# Patient Record
Sex: Male | Born: 1935 | Race: White | Hispanic: No | Marital: Single | Smoking: Never smoker
Health system: Southern US, Community
[De-identification: ages and names within clinical notes are randomized; demographics above are authoritative.]

## PROBLEM LIST (undated history)

## (undated) ENCOUNTER — Emergency Department (HOSPITAL_COMMUNITY): Admission: EM | Payer: Self-pay | Source: Home / Self Care

## (undated) DIAGNOSIS — R42 Dizziness and giddiness: Secondary | ICD-10-CM

## (undated) DIAGNOSIS — E119 Type 2 diabetes mellitus without complications: Secondary | ICD-10-CM

## (undated) DIAGNOSIS — H9190 Unspecified hearing loss, unspecified ear: Secondary | ICD-10-CM

## (undated) DIAGNOSIS — M169 Osteoarthritis of hip, unspecified: Secondary | ICD-10-CM

## (undated) DIAGNOSIS — I10 Essential (primary) hypertension: Secondary | ICD-10-CM

## (undated) DIAGNOSIS — L719 Rosacea, unspecified: Secondary | ICD-10-CM

## (undated) HISTORY — DX: Osteoarthritis of hip, unspecified: M16.9

## (undated) HISTORY — PX: NO PAST SURGERIES: SHX2092

---

## 2003-06-14 DIAGNOSIS — C439 Malignant melanoma of skin, unspecified: Secondary | ICD-10-CM

## 2003-06-14 DIAGNOSIS — C4491 Basal cell carcinoma of skin, unspecified: Secondary | ICD-10-CM

## 2003-06-14 HISTORY — DX: Malignant melanoma of skin, unspecified: C43.9

## 2003-06-14 HISTORY — DX: Basal cell carcinoma of skin, unspecified: C44.91

## 2008-09-29 ENCOUNTER — Emergency Department (HOSPITAL_COMMUNITY): Admission: EM | Admit: 2008-09-29 | Discharge: 2008-09-29 | Payer: Self-pay | Admitting: Emergency Medicine

## 2011-03-16 ENCOUNTER — Encounter (HOSPITAL_COMMUNITY): Payer: Self-pay | Admitting: Radiology

## 2011-03-16 ENCOUNTER — Emergency Department (HOSPITAL_COMMUNITY): Payer: Medicare Other

## 2011-03-16 ENCOUNTER — Emergency Department (HOSPITAL_COMMUNITY)
Admission: EM | Admit: 2011-03-16 | Discharge: 2011-03-16 | Disposition: A | Discharge status: Home / Self Care | Payer: Medicare Other | Attending: Emergency Medicine | Admitting: Emergency Medicine

## 2011-03-16 DIAGNOSIS — R42 Dizziness and giddiness: Secondary | ICD-10-CM | POA: Insufficient documentation

## 2011-03-16 DIAGNOSIS — E785 Hyperlipidemia, unspecified: Secondary | ICD-10-CM | POA: Insufficient documentation

## 2011-03-16 DIAGNOSIS — I10 Essential (primary) hypertension: Secondary | ICD-10-CM | POA: Insufficient documentation

## 2011-03-16 HISTORY — DX: Essential (primary) hypertension: I10

## 2011-03-16 LAB — COMPREHENSIVE METABOLIC PANEL
Alkaline Phosphatase: 55 U/L (ref 39–117)
BUN: 13 mg/dL (ref 6–23)
Chloride: 101 mEq/L (ref 96–112)
Creatinine, Ser: 0.8 mg/dL (ref 0.4–1.5)
GFR calc non Af Amer: >60 mL/min (ref >60)
Glucose, Bld: 164 mg/dL — ABNORMAL HIGH (ref 70–99)
Potassium: 3.1 mEq/L — ABNORMAL LOW (ref 3.5–5.1)
Total Bilirubin: 0.7 mg/dL (ref 0.3–1.2)

## 2011-03-16 LAB — CBC
HCT: 38.5 % — ABNORMAL LOW (ref 39.0–52.0)
MCH: 29.1 pg (ref 26.0–34.0)
MCV: 83.7 fL (ref 78.0–100.0)
RBC: 4.6 MIL/uL (ref 4.22–5.81)
RDW: 12.8 % (ref 11.5–15.5)
WBC: 4.4 10*3/uL (ref 4.0–10.5)

## 2011-03-16 LAB — DIFFERENTIAL
Eosinophils Absolute: 0.1 10*3/uL (ref 0.0–0.7)
Eosinophils Relative: 2 % (ref 0–5)
Lymphocytes Relative: 28 % (ref 12–46)
Lymphs Abs: 1.2 10*3/uL (ref 0.7–4.0)
Monocytes Relative: 9 % (ref 3–12)

## 2011-10-01 LAB — BASIC METABOLIC PANEL WITH GFR
BUN: 13
CO2: 30
Calcium: 9.4
Chloride: 102
Creatinine, Ser: 0.97
GFR calc Af Amer: >60
GFR calc non Af Amer: >60
Glucose, Bld: 193 — ABNORMAL HIGH
Potassium: 3.1 — ABNORMAL LOW
Sodium: 138

## 2011-10-01 LAB — CBC
HCT: 41.7
Hemoglobin: 14.4
MCHC: 34.4
MCV: 87.6
Platelets: 190
RBC: 4.76
RDW: 12.8
WBC: 4.5

## 2011-10-01 LAB — DIFFERENTIAL
Basophils Absolute: 0
Basophils Relative: 1
Eosinophils Absolute: 0
Eosinophils Relative: 1
Lymphocytes Relative: 26
Lymphs Abs: 1.2
Monocytes Absolute: 0.3
Monocytes Relative: 7
Neutro Abs: 2.9
Neutrophils Relative %: 66

## 2011-10-01 LAB — POCT CARDIAC MARKERS
CKMB, poc: 7.9
Myoglobin, poc: 241
Troponin i, poc: <0.05

## 2013-08-18 ENCOUNTER — Other Ambulatory Visit: Payer: Self-pay | Admitting: Physician Assistant

## 2013-08-18 ENCOUNTER — Encounter: Payer: Self-pay | Admitting: Physician Assistant

## 2013-08-18 DIAGNOSIS — I10 Essential (primary) hypertension: Secondary | ICD-10-CM

## 2013-08-18 DIAGNOSIS — M169 Osteoarthritis of hip, unspecified: Secondary | ICD-10-CM

## 2013-08-18 NOTE — H&P (Signed)
TOTAL HIP ADMISSION H&P  Patient is admitted for right total hip arthroplasty.  Subjective:  Chief Complaint: right hip pain  HPI: David Herrera, 79 y.o. male, has a history of pain and functional disability in the right hip(s) due to arthritis and patient has failed non-surgical conservative treatments for greater than 12 weeks to include NSAID's and/or analgesics, flexibility and strengthening excercises, supervised PT with diminished ADL's post treatment, use of assistive devices and activity modification.  Onset of symptoms was gradual starting 8 years ago with gradually worsening course since that time.The patient noted no past surgery on the right hip(s).  Patient currently rates pain in the right hip at 10 out of 10 with activity. Patient has night pain, worsening of pain with activity and weight bearing, trendelenberg gait, pain that interfers with activities of daily living, pain with passive range of motion and crepitus. Patient has evidence of subchondral cysts, periarticular osteophytes and joint space narrowing by imaging studies. This condition presents safety issues increasing the risk of falls. T There is no current active infection.  Patient Active Problem List   Diagnosis Date Noted  . Hypertension   . DJD (degenerative joint disease) of hip    Past Medical History  Diagnosis Date  . Hypertension   . DJD (degenerative joint disease) of hip     right    No past surgical history on file.   (Not in a hospital admission) No Known Allergies   Current outpatient prescriptions:aspirin 81 MG tablet, Take 81 mg by mouth daily., Disp: , Rfl: ;  diclofenac (VOLTAREN) 75 MG EC tablet, Take 75 mg by mouth 2 (two) times daily., Disp: , Rfl: ;  hydrochlorothiazide (HYDRODIURIL) 25 MG tablet, Take 25 mg by mouth daily., Disp: , Rfl: ;  simvastatin (ZOCOR) 20 MG tablet, Take 20 mg by mouth every evening., Disp: , Rfl:    History  Substance Use Topics  . Smoking status: Never  Smoker   . Smokeless tobacco: Not on file  . Alcohol Use: Not on file    Family History  Problem Relation Age of Onset  . Diabetes      Social: Married, lives with wife, no alcohol   Review of Systems  Constitutional: Negative.   HENT: Negative.   Eyes: Negative.   Cardiovascular: Negative.   Gastrointestinal: Negative.   Genitourinary: Negative.   Musculoskeletal: Positive for joint pain.       Hip and knee pain  Skin: Negative.   Endo/Heme/Allergies: Negative.   Psychiatric/Behavioral: Negative.     Objective:  Physical Exam  Constitutional: He is oriented to person, place, and time. He appears well-developed and well-nourished.  HENT:  Head: Normocephalic and atraumatic.  Mouth/Throat: Oropharynx is clear and moist.  Eyes: Conjunctivae and EOM are normal. Pupils are equal, round, and reactive to light.  Neck: Neck supple.  Cardiovascular: Normal rate, regular rhythm and normal heart sounds.   Respiratory: Effort normal and breath sounds normal.  GI: Soft. Bowel sounds are normal.  Genitourinary:  Not pertinent to current symptomatology therefore not examined.  Musculoskeletal:   He walks with an antalgic gait. He has pain with rising from a chair to the table. Left hip has internal rotation of 10 degrees external rotation 15 degrees minimal pain. He has good range of motion of the knee 0-115 degrees with good stability positive crepitus minimal pain. Exam of the right hip shows no internal or external rotation flexion 85 degrees with pain, he has pain with flexed and  log roll rotation. Exam of his right knee reveals positive crepitus with pain over the lateral side with stable ligaments. He is neurovascularly intact skin is warm and dry.   Neurological: He is alert and oriented to person, place, and time.  Skin: Skin is warm and dry.  Psychiatric: He has a normal mood and affect. His behavior is normal. Judgment and thought content normal.    Vital signs in last 24  hours: Ht 5'8"    Wt160lbs BP 125/70    Pulse 88   Temp 98   O2 97 RA  Labs:      Imaging Review Plain radiographs demonstrate severe degenerative joint disease of the right hip(s). The bone quality appears to be good for age and reported activity level.  Assessment/Plan:  End stage arthritis, right hip(s)  The patient history, physical examination, clinical judgement of the provider and imaging studies are consistent with end stage degenerative joint disease of the right hip(s) and total hip arthroplasty is deemed medically necessary. The treatment options including medical management, injection therapy, arthroscopy and arthroplasty were discussed at length. The risks and benefits of total hip arthroplasty were presented and reviewed. The risks due to aseptic loosening, infection, stiffness, dislocation/subluxation,  thromboembolic complications and other imponderables were discussed.  The patient acknowledged the explanation, agreed to proceed with the plan and consent was signed. Patient is being admitted for inpatient treatment for surgery, pain control, PT, OT, prophylactic antibiotics, VTE prophylaxis, progressive ambulation and ADL's and discharge planning.The patient is planning to be discharged to skilled nursing facility  Cass Edinger A. Gwinda Passe Physician Assistant [facility] [phone]  08/18/2013, 11:37 AM

## 2013-08-24 NOTE — Pre-Procedure Instructions (Signed)
David Herrera  08/24/2013   Your procedure is scheduled on:  Wednesday, September 3rd.  Report to [facility] at 8:00 AM.  Call this number if you have problems the morning of surgery: [phone]   Remember:   Do not eat food or drink liquids after midnight.   Take these medicines the morning of surgery with A SIP OF WATER: None. Stop taking Aspirin, Coumadin, Plavix, Effient and Herbal medications, ie GLucosamine, Multi- Vitamin, Vitamin D..  Do not take any NSAIDs ie: Ibuprofen,  Advil,Naproxen, Voltaren, or any medication containing Aspirin.   Do not wear jewelry, make-up or nail polish.  Do not wear lotions, powders, or perfumes. You may wear deodorant.  Do not shave 48 hours prior to surgery. Men may shave face and neck.  Do not bring valuables to the hospital.  [facility] is not responsible for any belongings or valuables.  Contacts, dentures or bridgework may not be worn into surgery.  Leave suitcase in the car. After surgery it may be brought to your room.  For patients admitted to the hospital, checkout time is 11:00 AM the day of discharge.     Special Instructions: Shower using CHG 2 nights before surgery and the night before surgery.  If you shower the day of surgery use CHG.  Use special wash - you have one bottle of CHG for all showers.  You should use approximately 1/3 of the bottle for each shower.   Please read over the following fact sheets that you were given: Pain Booklet, Coughing and Deep Breathing, Blood Transfusion Information and Surgical Site Infection Prevention and Incentive Spirometry.

## 2013-08-25 ENCOUNTER — Encounter (HOSPITAL_COMMUNITY): Payer: Self-pay

## 2013-08-25 ENCOUNTER — Encounter (HOSPITAL_COMMUNITY)
Admission: RE | Admit: 2013-08-25 | Discharge: 2013-08-25 | Disposition: A | Discharge status: Home / Self Care | Payer: Medicare Other | Source: Ambulatory Visit | Attending: Orthopedic Surgery | Admitting: Orthopedic Surgery

## 2013-08-25 ENCOUNTER — Ambulatory Visit (HOSPITAL_COMMUNITY)
Admission: RE | Admit: 2013-08-25 | Discharge: 2013-08-25 | Disposition: A | Discharge status: Home / Self Care | Payer: Medicare Other | Source: Ambulatory Visit | Attending: Physician Assistant | Admitting: Physician Assistant

## 2013-08-25 DIAGNOSIS — Z01810 Encounter for preprocedural cardiovascular examination: Secondary | ICD-10-CM | POA: Insufficient documentation

## 2013-08-25 DIAGNOSIS — Z01818 Encounter for other preprocedural examination: Secondary | ICD-10-CM | POA: Insufficient documentation

## 2013-08-25 DIAGNOSIS — Z01812 Encounter for preprocedural laboratory examination: Secondary | ICD-10-CM | POA: Insufficient documentation

## 2013-08-25 LAB — CBC WITH DIFFERENTIAL/PLATELET
Basophils Absolute: 0 10*3/uL (ref 0.0–0.1)
Lymphocytes Relative: 22 % (ref 12–46)
Lymphs Abs: 1.8 10*3/uL (ref 0.7–4.0)
MCV: 85.8 fL (ref 78.0–100.0)
Neutro Abs: 5.6 10*3/uL (ref 1.7–7.7)
Neutrophils Relative %: 69 % (ref 43–77)
Platelets: 201 10*3/uL (ref 150–400)
RBC: 5 MIL/uL (ref 4.22–5.81)
WBC: 8.1 10*3/uL (ref 4.0–10.5)

## 2013-08-25 LAB — COMPREHENSIVE METABOLIC PANEL
ALT: 17 U/L (ref 0–53)
AST: 22 U/L (ref 0–37)
Alkaline Phosphatase: 88 U/L (ref 39–117)
CO2: 28 mEq/L (ref 19–32)
Chloride: 102 mEq/L (ref 96–112)
GFR calc Af Amer: >90 mL/min (ref >90)
GFR calc non Af Amer: >90 mL/min (ref >90)
Glucose, Bld: 120 mg/dL — ABNORMAL HIGH (ref 70–99)
Potassium: 4.1 mEq/L (ref 3.5–5.1)
Sodium: 141 mEq/L (ref 135–145)

## 2013-08-25 LAB — URINALYSIS, ROUTINE W REFLEX MICROSCOPIC
Glucose, UA: NEGATIVE mg/dL
Hgb urine dipstick: NEGATIVE
Leukocytes, UA: NEGATIVE
Protein, ur: NEGATIVE mg/dL
Specific Gravity, Urine: 1.007 (ref 1.005–1.030)
Urobilinogen, UA: 0.2 mg/dL (ref 0.0–1.0)

## 2013-08-25 LAB — SURGICAL PCR SCREEN: Staphylococcus aureus: POSITIVE — AB

## 2013-08-25 LAB — TYPE AND SCREEN: Antibody Screen: NEGATIVE

## 2013-08-25 LAB — APTT: aPTT: 28 seconds (ref 24–37)

## 2013-08-25 MED ORDER — CHLORHEXIDINE GLUCONATE 4 % EX LIQD: 60.0000 mL | Freq: Once | CUTANEOUS | Status: DC

## 2013-08-26 LAB — URINE CULTURE: Colony Count: NO GROWTH

## 2013-09-01 MED ORDER — TRANEXAMIC ACID 100 MG/ML IV SOLN
1000.0000 mg | INTRAVENOUS | Status: AC
  Administered 2013-09-02: 1000 mg via INTRAVENOUS
  Filled 2013-09-01: qty 10

## 2013-09-01 MED ORDER — CEFAZOLIN SODIUM-DEXTROSE 2-3 GM-% IV SOLR
2.0000 g | INTRAVENOUS | Status: AC
  Administered 2013-09-02: 2 g via INTRAVENOUS
  Filled 2013-09-01: qty 50

## 2013-09-02 ENCOUNTER — Inpatient Hospital Stay (HOSPITAL_COMMUNITY): Payer: Medicare Other

## 2013-09-02 ENCOUNTER — Encounter (HOSPITAL_COMMUNITY): Payer: Self-pay | Admitting: Anesthesiology

## 2013-09-02 ENCOUNTER — Inpatient Hospital Stay (HOSPITAL_COMMUNITY)
Admission: RE | Admit: 2013-09-02 | Discharge: 2013-09-04 | DRG: 470 | Disposition: A | Discharge status: Home Health | Payer: Medicare Other | Source: Ambulatory Visit | Attending: Orthopedic Surgery | Admitting: Orthopedic Surgery

## 2013-09-02 ENCOUNTER — Encounter (HOSPITAL_COMMUNITY): Payer: Self-pay | Admitting: *Deleted

## 2013-09-02 ENCOUNTER — Inpatient Hospital Stay (HOSPITAL_COMMUNITY): Payer: Medicare Other | Admitting: Anesthesiology

## 2013-09-02 ENCOUNTER — Encounter (HOSPITAL_COMMUNITY)
Admission: RE | Disposition: A | Discharge status: Home Health | Payer: Self-pay | Source: Ambulatory Visit | Attending: Orthopedic Surgery

## 2013-09-02 DIAGNOSIS — M1610 Unilateral primary osteoarthritis, unspecified hip: Principal | ICD-10-CM | POA: Diagnosis present

## 2013-09-02 DIAGNOSIS — Z79899 Other long term (current) drug therapy: Secondary | ICD-10-CM

## 2013-09-02 DIAGNOSIS — M899 Disorder of bone, unspecified: Secondary | ICD-10-CM | POA: Diagnosis present

## 2013-09-02 DIAGNOSIS — I10 Essential (primary) hypertension: Secondary | ICD-10-CM | POA: Diagnosis present

## 2013-09-02 DIAGNOSIS — M169 Osteoarthritis of hip, unspecified: Secondary | ICD-10-CM

## 2013-09-02 DIAGNOSIS — Z7982 Long term (current) use of aspirin: Secondary | ICD-10-CM

## 2013-09-02 HISTORY — PX: TOTAL HIP ARTHROPLASTY: SHX124

## 2013-09-02 SURGERY — ARTHROPLASTY, HIP, TOTAL,POSTERIOR APPROACH
Anesthesia: General | Site: Hip | Laterality: Right | Wound class: Clean

## 2013-09-02 MED ORDER — METHOCARBAMOL 500 MG PO TABS
ORAL_TABLET | ORAL | Status: AC
  Administered 2013-09-02: 500 mg
  Filled 2013-09-02: qty 1

## 2013-09-02 MED ORDER — LACTATED RINGERS IV SOLN
INTRAVENOUS | Status: DC
  Administered 2013-09-02 (×2): via INTRAVENOUS

## 2013-09-02 MED ORDER — SODIUM CHLORIDE 0.9 % IJ SOLN
INTRAMUSCULAR | Status: DC | PRN
  Administered 2013-09-02: 13:00:00

## 2013-09-02 MED ORDER — CHLORHEXIDINE GLUCONATE 4 % EX LIQD: 60.0000 mL | Freq: Once | CUTANEOUS | Status: DC

## 2013-09-02 MED ORDER — ASPIRIN EC 325 MG PO TBEC
325.0000 mg | DELAYED_RELEASE_TABLET | Freq: Every day | ORAL | Status: DC
  Administered 2013-09-03 – 2013-09-04 (×2): 325 mg via ORAL
  Filled 2013-09-02 (×3): qty 1

## 2013-09-02 MED ORDER — ALBUMIN HUMAN 5 % IV SOLN
INTRAVENOUS | Status: DC | PRN
  Administered 2013-09-02: 12:00:00 via INTRAVENOUS

## 2013-09-02 MED ORDER — ZOLPIDEM TARTRATE 5 MG PO TABS: 5.0000 mg | ORAL_TABLET | Freq: Every evening | ORAL | Status: DC | PRN

## 2013-09-02 MED ORDER — GLYCOPYRROLATE 0.2 MG/ML IJ SOLN
INTRAMUSCULAR | Status: DC | PRN
  Administered 2013-09-02: .4 mg via INTRAVENOUS

## 2013-09-02 MED ORDER — ACETAMINOPHEN 325 MG PO TABS: 650.0000 mg | ORAL_TABLET | Freq: Four times a day (QID) | ORAL | Status: DC | PRN

## 2013-09-02 MED ORDER — ROCURONIUM BROMIDE 100 MG/10ML IV SOLN
INTRAVENOUS | Status: DC | PRN
  Administered 2013-09-02: 50 mg via INTRAVENOUS

## 2013-09-02 MED ORDER — ARTIFICIAL TEARS OP OINT
TOPICAL_OINTMENT | OPHTHALMIC | Status: DC | PRN
  Administered 2013-09-02: 1 via OPHTHALMIC

## 2013-09-02 MED ORDER — CELECOXIB 200 MG PO CAPS
200.0000 mg | ORAL_CAPSULE | Freq: Two times a day (BID) | ORAL | Status: DC
  Administered 2013-09-02 – 2013-09-04 (×4): 200 mg via ORAL
  Filled 2013-09-02 (×5): qty 1

## 2013-09-02 MED ORDER — MIDAZOLAM HCL 5 MG/5ML IJ SOLN
INTRAMUSCULAR | Status: DC | PRN
  Administered 2013-09-02: 1 mg via INTRAVENOUS

## 2013-09-02 MED ORDER — ONDANSETRON HCL 4 MG/2ML IJ SOLN
INTRAMUSCULAR | Status: DC | PRN
  Administered 2013-09-02: 4 mg via INTRAVENOUS

## 2013-09-02 MED ORDER — SIMVASTATIN 20 MG PO TABS
20.0000 mg | ORAL_TABLET | Freq: Every evening | ORAL | Status: DC
  Administered 2013-09-03: 20 mg via ORAL
  Filled 2013-09-02 (×3): qty 1

## 2013-09-02 MED ORDER — BUPIVACAINE HCL 0.5 % IJ SOLN
INTRAMUSCULAR | Status: DC | PRN
  Administered 2013-09-02: 29 mL

## 2013-09-02 MED ORDER — OXYCODONE HCL 5 MG/5ML PO SOLN: 5.0000 mg | Freq: Once | ORAL | Status: AC | PRN

## 2013-09-02 MED ORDER — HYDROMORPHONE HCL PF 1 MG/ML IJ SOLN
INTRAMUSCULAR | Status: AC
  Administered 2013-09-02: 0.5 mg via INTRAVENOUS
  Filled 2013-09-02: qty 1

## 2013-09-02 MED ORDER — ONDANSETRON HCL 4 MG PO TABS: 4.0000 mg | ORAL_TABLET | Freq: Four times a day (QID) | ORAL | Status: DC | PRN

## 2013-09-02 MED ORDER — BUPIVACAINE HCL (PF) 0.5 % IJ SOLN
INTRAMUSCULAR | Status: AC
  Filled 2013-09-02: qty 30

## 2013-09-02 MED ORDER — METHOCARBAMOL 500 MG PO TABS
500.0000 mg | ORAL_TABLET | Freq: Four times a day (QID) | ORAL | Status: DC | PRN
  Administered 2013-09-03: 500 mg via ORAL
  Filled 2013-09-02: qty 1

## 2013-09-02 MED ORDER — ACETAMINOPHEN 650 MG RE SUPP: 650.0000 mg | Freq: Four times a day (QID) | RECTAL | Status: DC | PRN

## 2013-09-02 MED ORDER — POVIDONE-IODINE 7.5 % EX SOLN
Freq: Once | CUTANEOUS | Status: DC
  Filled 2013-09-02: qty 118

## 2013-09-02 MED ORDER — PHENYLEPHRINE HCL 10 MG/ML IJ SOLN
INTRAMUSCULAR | Status: DC | PRN
  Administered 2013-09-02: 60 ug via INTRAVENOUS

## 2013-09-02 MED ORDER — OXYCODONE HCL 5 MG PO TABS
ORAL_TABLET | ORAL | Status: AC
  Filled 2013-09-02: qty 1

## 2013-09-02 MED ORDER — MENTHOL 3 MG MT LOZG: 1.0000 | LOZENGE | OROMUCOSAL | Status: DC | PRN

## 2013-09-02 MED ORDER — HYDROCHLOROTHIAZIDE 25 MG PO TABS
25.0000 mg | ORAL_TABLET | Freq: Every day | ORAL | Status: DC
  Administered 2013-09-03 – 2013-09-04 (×2): 25 mg via ORAL
  Filled 2013-09-02 (×2): qty 1

## 2013-09-02 MED ORDER — OXYCODONE HCL 5 MG PO TABS
5.0000 mg | ORAL_TABLET | Freq: Once | ORAL | Status: AC | PRN
Start: 2013-09-02 — End: 2013-09-02
  Administered 2013-09-02: 5 mg via ORAL

## 2013-09-02 MED ORDER — SODIUM CHLORIDE 0.9 % IR SOLN
Status: DC | PRN
  Administered 2013-09-02: 1000 mL

## 2013-09-02 MED ORDER — METOCLOPRAMIDE HCL 10 MG PO TABS: 5.0000 mg | ORAL_TABLET | Freq: Three times a day (TID) | ORAL | Status: DC | PRN

## 2013-09-02 MED ORDER — NEOSTIGMINE METHYLSULFATE 1 MG/ML IJ SOLN
INTRAMUSCULAR | Status: DC | PRN
  Administered 2013-09-02: 3 mg via INTRAVENOUS

## 2013-09-02 MED ORDER — OXYCODONE HCL 5 MG PO TABS
5.0000 mg | ORAL_TABLET | ORAL | Status: DC | PRN
  Administered 2013-09-02 – 2013-09-03 (×3): 10 mg via ORAL
  Filled 2013-09-02 (×3): qty 2

## 2013-09-02 MED ORDER — DOCUSATE SODIUM 100 MG PO CAPS
100.0000 mg | ORAL_CAPSULE | Freq: Two times a day (BID) | ORAL | Status: DC
  Administered 2013-09-02 – 2013-09-04 (×4): 100 mg via ORAL
  Filled 2013-09-02 (×5): qty 1

## 2013-09-02 MED ORDER — METOCLOPRAMIDE HCL 5 MG/ML IJ SOLN: 10.0000 mg | Freq: Once | INTRAMUSCULAR | Status: DC | PRN

## 2013-09-02 MED ORDER — HYDROMORPHONE HCL PF 1 MG/ML IJ SOLN
0.2500 mg | INTRAMUSCULAR | Status: DC | PRN
  Administered 2013-09-02 (×3): 0.5 mg via INTRAVENOUS

## 2013-09-02 MED ORDER — BUPIVACAINE LIPOSOME 1.3 % IJ SUSP
20.0000 mL | Freq: Once | INTRAMUSCULAR | Status: DC
  Filled 2013-09-02: qty 20

## 2013-09-02 MED ORDER — TRANEXAMIC ACID 100 MG/ML IV SOLN
1000.0000 mg | INTRAVENOUS | Status: DC
  Filled 2013-09-02: qty 10

## 2013-09-02 MED ORDER — METHOCARBAMOL 100 MG/ML IJ SOLN
500.0000 mg | Freq: Four times a day (QID) | INTRAVENOUS | Status: DC | PRN
  Filled 2013-09-02: qty 5

## 2013-09-02 MED ORDER — POTASSIUM CHLORIDE IN NACL 20-0.9 MEQ/L-% IV SOLN
INTRAVENOUS | Status: DC
  Administered 2013-09-02 – 2013-09-03 (×2): via INTRAVENOUS
  Filled 2013-09-02 (×6): qty 1000

## 2013-09-02 MED ORDER — LIDOCAINE HCL (CARDIAC) 20 MG/ML IV SOLN
INTRAVENOUS | Status: DC | PRN
  Administered 2013-09-02: 70 mg via INTRAVENOUS

## 2013-09-02 MED ORDER — HYDROMORPHONE HCL PF 1 MG/ML IJ SOLN: 0.5000 mg | INTRAMUSCULAR | Status: DC | PRN

## 2013-09-02 MED ORDER — FENTANYL CITRATE 0.05 MG/ML IJ SOLN
INTRAMUSCULAR | Status: DC | PRN
  Administered 2013-09-02: 50 ug via INTRAVENOUS
  Administered 2013-09-02: 100 ug via INTRAVENOUS

## 2013-09-02 MED ORDER — HYDROMORPHONE HCL PF 1 MG/ML IJ SOLN
INTRAMUSCULAR | Status: AC
  Filled 2013-09-02: qty 1

## 2013-09-02 MED ORDER — ONDANSETRON HCL 4 MG/2ML IJ SOLN: 4.0000 mg | Freq: Four times a day (QID) | INTRAMUSCULAR | Status: DC | PRN

## 2013-09-02 MED ORDER — CEFAZOLIN SODIUM 1-5 GM-% IV SOLN
1.0000 g | Freq: Four times a day (QID) | INTRAVENOUS | Status: AC
  Administered 2013-09-02 (×2): 1 g via INTRAVENOUS
  Filled 2013-09-02 (×3): qty 50

## 2013-09-02 MED ORDER — PHENOL 1.4 % MT LIQD: 1.0000 | OROMUCOSAL | Status: DC | PRN

## 2013-09-02 MED ORDER — DIPHENHYDRAMINE HCL 12.5 MG/5ML PO ELIX: 12.5000 mg | ORAL_SOLUTION | ORAL | Status: DC | PRN

## 2013-09-02 MED ORDER — METOCLOPRAMIDE HCL 5 MG/ML IJ SOLN: 5.0000 mg | Freq: Three times a day (TID) | INTRAMUSCULAR | Status: DC | PRN

## 2013-09-02 MED ORDER — PROPOFOL 10 MG/ML IV BOLUS
INTRAVENOUS | Status: DC | PRN
  Administered 2013-09-02: 170 mg via INTRAVENOUS

## 2013-09-02 MED ORDER — MUPIROCIN 2 % EX OINT
TOPICAL_OINTMENT | CUTANEOUS | Status: AC
  Administered 2013-09-02: 10:00:00
  Filled 2013-09-02: qty 22

## 2013-09-02 SURGICAL SUPPLY — 70 items
BENZOIN TINCTURE PRP APPL 2/3 (GAUZE/BANDAGES/DRESSINGS) ×2 IMPLANT
BLADE SAW SAG 73X25 THK (BLADE) ×1
BLADE SAW SGTL 73X25 THK (BLADE) ×1 IMPLANT
BOOTCOVER CLEANROOM LRG (PROTECTIVE WEAR) IMPLANT
BRUSH FEMORAL CANAL (MISCELLANEOUS) IMPLANT
CLOTH BEACON ORANGE TIMEOUT ST (SAFETY) IMPLANT
COVER BACK TABLE 24X17X13 BIG (DRAPES) IMPLANT
COVER SURGICAL LIGHT HANDLE (MISCELLANEOUS) ×2 IMPLANT
DRAPE INCISE IOBAN 66X45 STRL (DRAPES) IMPLANT
DRAPE ORTHO SPLIT 77X108 STRL (DRAPES) ×2
DRAPE SURG ORHT 6 SPLT 77X108 (DRAPES) ×2 IMPLANT
DRAPE U-SHAPE 47X51 STRL (DRAPES) ×2 IMPLANT
DRILL BIT 7/64X5 (BIT) ×2 IMPLANT
DRSG PAD ABDOMINAL 8X10 ST (GAUZE/BANDAGES/DRESSINGS) ×2 IMPLANT
DURAPREP 26ML APPLICATOR (WOUND CARE) ×2 IMPLANT
ELECT BLADE 6.5 EXT (BLADE) IMPLANT
ELECT CAUTERY BLADE 6.4 (BLADE) ×2 IMPLANT
ELECT REM PT RETURN 9FT ADLT (ELECTROSURGICAL) ×2
ELECTRODE REM PT RTRN 9FT ADLT (ELECTROSURGICAL) ×1 IMPLANT
EVACUATOR 1/8 PVC DRAIN (DRAIN) IMPLANT
FACESHIELD LNG OPTICON STERILE (SAFETY) ×4 IMPLANT
GAUZE XEROFORM 5X9 LF (GAUZE/BANDAGES/DRESSINGS) ×2 IMPLANT
GLOVE BIO SURGEON STRL SZ8 (GLOVE) ×2 IMPLANT
GLOVE BIOGEL PI IND STRL 8 (GLOVE) ×1 IMPLANT
GLOVE BIOGEL PI IND STRL 8.5 (GLOVE) ×1 IMPLANT
GLOVE BIOGEL PI INDICATOR 8 (GLOVE) ×1
GLOVE BIOGEL PI INDICATOR 8.5 (GLOVE) ×1
GLOVE ORTHO TXT STRL SZ7.5 (GLOVE) ×4 IMPLANT
GLOVE SURG SS PI 8.5 STRL IVOR (GLOVE) ×4
GLOVE SURG SS PI 8.5 STRL STRW (GLOVE) ×4 IMPLANT
GOWN PREVENTION PLUS XLARGE (GOWN DISPOSABLE) ×2 IMPLANT
GOWN PREVENTION PLUS XXLARGE (GOWN DISPOSABLE) ×4 IMPLANT
GOWN STRL NON-REIN LRG LVL3 (GOWN DISPOSABLE) IMPLANT
GOWN STRL REIN 2XL XLG LVL4 (GOWN DISPOSABLE) ×4 IMPLANT
HANDPIECE INTERPULSE COAX TIP (DISPOSABLE)
HIP/VIT E LINR/CERM HD LEV 1B ×2 IMPLANT
KIT BASIN OR (CUSTOM PROCEDURE TRAY) ×2 IMPLANT
KIT ROOM TURNOVER OR (KITS) ×2 IMPLANT
MANIFOLD NEPTUNE II (INSTRUMENTS) ×2 IMPLANT
NEEDLE HYPO 25GX1X1/2 BEV (NEEDLE) IMPLANT
NS IRRIG 1000ML POUR BTL (IV SOLUTION) ×2 IMPLANT
PACK TOTAL JOINT (CUSTOM PROCEDURE TRAY) ×2 IMPLANT
PAD ARMBOARD 7.5X6 YLW CONV (MISCELLANEOUS) ×4 IMPLANT
PASSER SUT SWANSON 36MM LOOP (INSTRUMENTS) ×2 IMPLANT
PILLOW ABDUCTION HIP (SOFTGOODS) ×2 IMPLANT
PRESSURIZER FEMORAL UNIV (MISCELLANEOUS) IMPLANT
SET HNDPC FAN SPRY TIP SCT (DISPOSABLE) IMPLANT
SPONGE GAUZE 4X4 12PLY (GAUZE/BANDAGES/DRESSINGS) ×2 IMPLANT
SPONGE LAP 4X18 X RAY DECT (DISPOSABLE) IMPLANT
STAPLER VISISTAT 35W (STAPLE) ×2 IMPLANT
STRIP CLOSURE SKIN 1/2X4 (GAUZE/BANDAGES/DRESSINGS) ×4 IMPLANT
SUCTION FRAZIER TIP 10 FR DISP (SUCTIONS) ×2 IMPLANT
SUT FIBERWIRE #2 38 REV NDL BL (SUTURE) ×6
SUT VIC AB 1 CTX 36 (SUTURE) ×2
SUT VIC AB 1 CTX36XBRD ANBCTR (SUTURE) ×2 IMPLANT
SUT VIC AB 2-0 CT1 27 (SUTURE)
SUT VIC AB 2-0 CT1 TAPERPNT 27 (SUTURE) IMPLANT
SUT VIC AB 2-0 SH 27 (SUTURE) ×2
SUT VIC AB 2-0 SH 27XBRD (SUTURE) ×2 IMPLANT
SUT VIC AB 3-0 FS2 27 (SUTURE) ×2 IMPLANT
SUT VIC AB 3-0 SH 27 (SUTURE)
SUT VIC AB 3-0 SH 27X BRD (SUTURE) IMPLANT
SUTURE FIBERWR#2 38 REV NDL BL (SUTURE) ×3 IMPLANT
SYR 30ML SLIP (SYRINGE) ×2 IMPLANT
SYR CONTROL 10ML LL (SYRINGE) IMPLANT
TOWEL OR 17X24 6PK STRL BLUE (TOWEL DISPOSABLE) ×2 IMPLANT
TOWEL OR 17X26 10 PK STRL BLUE (TOWEL DISPOSABLE) ×2 IMPLANT
TOWER CARTRIDGE SMART MIX (DISPOSABLE) IMPLANT
TRAY FOLEY CATH 16FRSI W/METER (SET/KITS/TRAYS/PACK) ×2 IMPLANT
WATER STERILE IRR 1000ML POUR (IV SOLUTION) ×2 IMPLANT

## 2013-09-02 NOTE — Transfer of Care (Signed)
Immediate Anesthesia Transfer of Care Note  Patient: David Herrera  Procedure(s) Performed: Procedure(s): TOTAL HIP ARTHROPLASTY (Right)  Patient Location: PACU  Anesthesia Type:General  Level of Consciousness: awake, alert  and oriented  Airway & Oxygen Therapy: Patient Spontanous Breathing and Patient connected to nasal cannula oxygen  Post-op Assessment: Report given to PACU RN and Post -op Vital signs reviewed and stable  Post vital signs: Reviewed and stable  Complications: No apparent anesthesia complications

## 2013-09-02 NOTE — Interval H&P Note (Signed)
History and Physical Interval Note:  09/02/2013 8:26 AM  David Herrera  has presented today for surgery, with the diagnosis of DJD RIGHT HIP  The various methods of treatment have been discussed with the patient and family. After consideration of risks, benefits and other options for treatment, the patient has consented to  Procedure(s): TOTAL HIP ARTHROPLASTY (Right) as a surgical intervention .  The patient's history has been reviewed, patient examined, no change in status, stable for surgery.  I have reviewed the patient's chart and labs.  Questions were answered to the patient's satisfaction.     [name] F

## 2013-09-02 NOTE — Anesthesia Postprocedure Evaluation (Signed)
Anesthesia Post Note  Patient: David Herrera  Procedure(s) Performed: Procedure(s) (LRB): TOTAL HIP ARTHROPLASTY (Right)  Anesthesia type: General  Patient location: PACU  Post pain: Pain level controlled  Post assessment: Patient's Cardiovascular Status Stable  Last Vitals:  Filed Vitals:   09/02/13 1534  BP: 104/54  Pulse: 61  Temp: 36.6 C  Resp:     Post vital signs: Reviewed and stable  Level of consciousness: alert  Complications: No apparent anesthesia complications

## 2013-09-02 NOTE — Anesthesia Postprocedure Evaluation (Signed)
Anesthesia Post Note  Patient: David Herrera  Procedure(s) Performed: Procedure(s) (LRB): TOTAL HIP ARTHROPLASTY (Right)  Anesthesia type: General  Patient location: PACU  Post pain: Pain level controlled and Adequate analgesia  Post assessment: Post-op Vital signs reviewed, Patient's Cardiovascular Status Stable, Respiratory Function Stable, Patent Airway and Pain level controlled  Last Vitals:  Filed Vitals:   09/02/13 1415  BP: 134/85  Pulse: 57  Temp:   Resp: 15    Post vital signs: Reviewed and stable  Level of consciousness: awake, alert  and oriented  Complications: No apparent anesthesia complications

## 2013-09-02 NOTE — Preoperative (Signed)
Beta Blockers   Reason not to administer Beta Blockers:Not Applicable 

## 2013-09-02 NOTE — H&P (View-Only) (Signed)
TOTAL HIP ADMISSION H&P  Patient is admitted for right total hip arthroplasty.  Subjective:  Chief Complaint: right hip pain  HPI: David Herrera, 79 y.o. male, has a history of pain and functional disability in the right hip(s) due to arthritis and patient has failed non-surgical conservative treatments for greater than 12 weeks to include NSAID's and/or analgesics, flexibility and strengthening excercises, supervised PT with diminished ADL's post treatment, use of assistive devices and activity modification.  Onset of symptoms was gradual starting 8 years ago with gradually worsening course since that time.The patient noted no past surgery on the right hip(s).  Patient currently rates pain in the right hip at 10 out of 10 with activity. Patient has night pain, worsening of pain with activity and weight bearing, trendelenberg gait, pain that interfers with activities of daily living, pain with passive range of motion and crepitus. Patient has evidence of subchondral cysts, periarticular osteophytes and joint space narrowing by imaging studies. This condition presents safety issues increasing the risk of falls. T There is no current active infection.  Patient Active Problem List   Diagnosis Date Noted  . Hypertension   . DJD (degenerative joint disease) of hip    Past Medical History  Diagnosis Date  . Hypertension   . DJD (degenerative joint disease) of hip     right    No past surgical history on file.   (Not in a hospital admission) No Known Allergies   Current outpatient prescriptions:aspirin 81 MG tablet, Take 81 mg by mouth daily., Disp: , Rfl: ;  diclofenac (VOLTAREN) 75 MG EC tablet, Take 75 mg by mouth 2 (two) times daily., Disp: , Rfl: ;  hydrochlorothiazide (HYDRODIURIL) 25 MG tablet, Take 25 mg by mouth daily., Disp: , Rfl: ;  simvastatin (ZOCOR) 20 MG tablet, Take 20 mg by mouth every evening., Disp: , Rfl:    History  Substance Use Topics  . Smoking status: Never  Smoker   . Smokeless tobacco: Not on file  . Alcohol Use: Not on file    Family History  Problem Relation Age of Onset  . Diabetes      Social: Married, lives with wife, no alcohol   Review of Systems  Constitutional: Negative.   HENT: Negative.   Eyes: Negative.   Cardiovascular: Negative.   Gastrointestinal: Negative.   Genitourinary: Negative.   Musculoskeletal: Positive for joint pain.       Hip and knee pain  Skin: Negative.   Endo/Heme/Allergies: Negative.   Psychiatric/Behavioral: Negative.     Objective:  Physical Exam  Constitutional: He is oriented to person, place, and time. He appears well-developed and well-nourished.  HENT:  Head: Normocephalic and atraumatic.  Mouth/Throat: Oropharynx is clear and moist.  Eyes: Conjunctivae and EOM are normal. Pupils are equal, round, and reactive to light.  Neck: Neck supple.  Cardiovascular: Normal rate, regular rhythm and normal heart sounds.   Respiratory: Effort normal and breath sounds normal.  GI: Soft. Bowel sounds are normal.  Genitourinary:  Not pertinent to current symptomatology therefore not examined.  Musculoskeletal:   He walks with an antalgic gait. He has pain with rising from a chair to the table. Left hip has internal rotation of 10 degrees external rotation 15 degrees minimal pain. He has good range of motion of the knee 0-115 degrees with good stability positive crepitus minimal pain. Exam of the right hip shows no internal or external rotation flexion 85 degrees with pain, he has pain with flexed and   log roll rotation. Exam of his right knee reveals positive crepitus with pain over the lateral side with stable ligaments. He is neurovascularly intact skin is warm and dry.   Neurological: He is alert and oriented to person, place, and time.  Skin: Skin is warm and dry.  Psychiatric: He has a normal mood and affect. His behavior is normal. Judgment and thought content normal.    Vital signs in last 24  hours: Ht 5'8"    Wt160lbs BP 125/70    Pulse 88   Temp 98   O2 97 RA  Labs:      Imaging Review Plain radiographs demonstrate severe degenerative joint disease of the right hip(s). The bone quality appears to be good for age and reported activity level.  Assessment/Plan:  End stage arthritis, right hip(s)  The patient history, physical examination, clinical judgement of the provider and imaging studies are consistent with end stage degenerative joint disease of the right hip(s) and total hip arthroplasty is deemed medically necessary. The treatment options including medical management, injection therapy, arthroscopy and arthroplasty were discussed at length. The risks and benefits of total hip arthroplasty were presented and reviewed. The risks due to aseptic loosening, infection, stiffness, dislocation/subluxation,  thromboembolic complications and other imponderables were discussed.  The patient acknowledged the explanation, agreed to proceed with the plan and consent was signed. Patient is being admitted for inpatient treatment for surgery, pain control, PT, OT, prophylactic antibiotics, VTE prophylaxis, progressive ambulation and ADL's and discharge planning.The patient is planning to be discharged to skilled nursing facility  David Meritt A. Demetruis Depaul, PA-C Physician Assistant [facility] [phone]  08/18/2013, 11:37 AM 

## 2013-09-02 NOTE — Op Note (Signed)
NAMENECHEMIA, CHIAPPETTA NO.:  [id number]  MEDICAL RECORD NO.:  [id number]  LOCATION:  5N30C                        FACILITY:  [facility]  PHYSICIAN:  Loreta Ave, M.D. DATE OF BIRTH:  08-09-1933  DATE OF PROCEDURE:  09/02/2013 DATE OF DISCHARGE:                              OPERATIVE REPORT   PREOPERATIVE DIAGNOSES:  Right hip end-stage degenerative arthritis with extensive destructive changes.  An inch of shortening with lateral subluxation and complete destruction of the femoral head.  POSTOPERATIVE DIAGNOSES:  Right hip end-stage degenerative arthritis with extensive destructive changes.  An inch of shortening with lateral subluxation and complete destruction of the femoral head with bony fragmentation on the rim of the acetabulum especially superiorly. Numerous subchondral cystic changes within the acetabulum.  Moderate osteopenia throughout.  PROCEDURE:  Right total hip replacement utilizing a Stryker prosthesis. A press-fit 62 mm acetabular component screw fixation x2.  A 40 mm internal diameter polyethylene liner.  A press-fit #9 femoral stem with a 40+ 0 metallic head.  Augmentation of femoral fixation with Dall-Miles cable.  SURGEON:  Loreta Ave, M.D.  ASSISTANT:  Elige Radon Hix, PA present throughout the entire case and necessary for timely completion of procedure.  ANESTHESIA:  General.  BLOOD LOSS:  300 mL.  BLOOD GIVEN:  None.  SPECIMENS:  None.  CULTURES:  None.  COMPLICATIONS:  None.  DRESSINGS:  Soft compressive with abduction pillow.  PROCEDURE:  The patient was brought to the operating room, placed on the operating table in a supine position.  After adequate anesthesia had been obtained, turned to a lateral position with right side up.  Prepped and draped in usual sterile fashion.  Incision along the shaft of femur extending posterosuperior.  Skin and subcutaneous tissue divided. Hemostasis with cautery.  Iliotibial band  incised, Charnley retractor put in place, neurovascular structures identified and protected. External rotator and capsule taken down off the back of the intertrochanteric groove of the femur.  The femoral head was almost concave, subluxed laterally, markedly destroyed.  Femoral neck was cut 1 fingerbreadth above the lesser trochanter exposing acetabulum.  Numerous destructive changes and fragmentation of the margin superiorly and posteriorly.  Debrided back to a healthy bleeding bone.  Acetabulum was then brought sufficient bleeding bone for the component with appropriate medial and inferior placement.  Numerous cystic areas were curetted out and then packed with cancellous bone.  After appropriate trials, I then inserted 62 mm cup.  I had good coverage fixation and placement of 45 degrees of abduction, 20 degrees of anteversion.  Once that was seated, 2 screws through the cup to ensure stability.  40 mm internal diameter liner.  Attention was turned to the femur.  Reamers and broaches were brought up to good fitting with a #9 stem.  After appropriate trials, the definitive stem was seated.  As I was seating the final stem, he developed unicortical crack in the anterior calcar just going down the intertrochanteric groove.  Not gapped, nothing posterior.  I placed the Dall-Miles cable, tensioned that to prevent this from propagating.  No further fixation necessary and he could still be full weightbearing. The 40+ 0 head was attached, the hip reduced.  Excellent  restoration of leg lengths.  Excellent stability in flexion/extension.  Wound was copiously irrigated.  External rotator and capsule were repaired back to the intertrochanteric groove through drill holes and FiberWire tied over bony bridge.  Wound irrigated.  Soft tissues injected with Exparel. Iliotibial band closed with #1 Vicryl.  Skin and subcutaneous tissue with Vicryl and subcuticular closure.  Margins were injected  with Marcaine.  Sterile compressive dressing applied.  Returned to supine position.  Abduction pillow placed.  Anesthesia reversed.  Brought to the recovery room.  Tolerated the surgery well.  No complications.     Loreta Ave, M.D.     DFM/MEDQ  D:  09/02/2013  T:  09/02/2013  Job:  [id number]

## 2013-09-02 NOTE — Anesthesia Preprocedure Evaluation (Signed)
Anesthesia Evaluation  Patient identified by MRN, date of birth, ID band Patient awake    Reviewed: Allergy & Precautions, H&P , NPO status , Patient's Chart, lab work & pertinent test results, reviewed documented beta blocker date and time   Airway Mallampati: II TM Distance: >3 FB Neck ROM: full    Dental   Pulmonary neg pulmonary ROS,  breath sounds clear to auscultation        Cardiovascular hypertension, Rhythm:regular     Neuro/Psych negative neurological ROS  negative psych ROS   GI/Hepatic negative GI ROS, Neg liver ROS,   Endo/Other  negative endocrine ROS  Renal/GU negative Renal ROS  negative genitourinary   Musculoskeletal   Abdominal   Peds  Hematology negative hematology ROS (+)   Anesthesia Other Findings See surgeon's H&P   Reproductive/Obstetrics negative OB ROS                           Anesthesia Physical Anesthesia Plan  ASA: II  Anesthesia Plan: General   Post-op Pain Management:    Induction: Intravenous  Airway Management Planned: Oral ETT  Additional Equipment:   Intra-op Plan:   Post-operative Plan: Extubation in OR  Informed Consent: I have reviewed the patients History and Physical, chart, labs and discussed the procedure including the risks, benefits and alternatives for the proposed anesthesia with the patient or authorized representative who has indicated his/her understanding and acceptance.   Dental Advisory Given  Plan Discussed with: CRNA and Surgeon  Anesthesia Plan Comments:         Anesthesia Quick Evaluation

## 2013-09-03 ENCOUNTER — Encounter (HOSPITAL_COMMUNITY): Payer: Self-pay | Admitting: Orthopedic Surgery

## 2013-09-03 LAB — CBC
Hemoglobin: 9.3 g/dL — ABNORMAL LOW (ref 13.0–17.0)
MCV: 85.3 fL (ref 78.0–100.0)
Platelets: 149 10*3/uL — ABNORMAL LOW (ref 150–400)
RBC: 3.19 MIL/uL — ABNORMAL LOW (ref 4.22–5.81)
WBC: 9.4 10*3/uL (ref 4.0–10.5)

## 2013-09-03 LAB — BASIC METABOLIC PANEL
CO2: 26 mEq/L (ref 19–32)
Chloride: 107 mEq/L (ref 96–112)
Creatinine, Ser: 0.62 mg/dL (ref 0.50–1.35)

## 2013-09-03 NOTE — Clinical Social Work Note (Signed)
Clinical Social Worker received referral for possible ST-SNF placement.  Chart reviewed.  PT/OT recommending home with home health.  Spoke with RN Case Manager who will follow up with patient to discuss home health needs.    CSW signing off - please re consult if social work needs arise.  Jesse Jaretssi Kraker, LCSW [phone] 

## 2013-09-03 NOTE — Progress Notes (Signed)
Physical Therapy Treatment Patient Details Name: David Herrera MRN: [id number] DOB: 08/14/1933 Today's Date: 09/03/2013 Time: 0865-7846 PT Time Calculation (min): 29 min  PT Assessment / Plan / Recommendation  History of Present Illness David Herrera, 79 y.o. male, has a history of pain and functional disability in the right hip(s) due to arthritis and patient has failed non-surgical conservative treatments for greater than 12 weeks to include NSAID's and/or analgesics, flexibility and strengthening excercises, supervised PT with diminished ADL's post treatment, use of assistive devices and activity modification.   PT Comments   Patient is making good progress with PT.  From a mobility standpoint anticipate patient will be ready for DC home this weekend. Pt was considering SNF prior to d/c home, but pt is making great progress and has some assistance arranged at home. Recommend D/C home with HHPT services.     Follow Up Recommendations  Home health PT     Does the patient have the potential to tolerate intense rehabilitation     Barriers to Discharge        Equipment Recommendations  None recommended by PT    Recommendations for Other Services    Frequency 7X/week   Progress towards PT Goals Progress towards PT goals: Progressing toward goals  Plan Current plan remains appropriate    Precautions / Restrictions Precautions Precautions: Posterior Hip Precaution Comments: Pt recalled 3/3 hip precautions with questioning cues. Restrictions Weight Bearing Restrictions: Yes RLE Weight Bearing: Weight bearing as tolerated   Pertinent Vitals/Pain     Mobility  Bed Mobility Bed Mobility: Sit to Supine Supine to Sit: 4: Min assist;With rails Details for Bed Mobility Assistance: cues for technique Transfers Transfers: Sit to Stand;Stand to Sit Sit to Stand: 5: Supervision;With upper extremity assist;From chair/3-in-1 Stand to Sit: 5: Supervision;With upper extremity  assist;To bed Details for Transfer Assistance: cues to step out surgical leg with sitting. Ambulation/Gait Ambulation/Gait Assistance: 5: Supervision Ambulation Distance (Feet): 105 Feet Assistive device: Rolling walker Gait Pattern: Step-through pattern;Decreased step length - left Gait velocity: decreased Stairs: No    Exercises Total Joint Exercises Ankle Circles/Pumps: AROM;Strengthening;Both;10 reps;Seated Quad Sets: AROM;Strengthening;Right;10 reps;Seated Heel Slides: AAROM;Strengthening;Right;10 reps;Supine Hip ABduction/ADduction: AAROM;Strengthening;10 reps;Right Long Arc Quad: AROM;Strengthening;Right;10 reps;Seated   PT Diagnosis:    PT Problem List:   PT Treatment Interventions:     PT Goals (current goals can now be found in the care plan section)    Visit Information  Last PT Received On: 09/03/13 Assistance Needed: +1 History of Present Illness: David Herrera, 79 y.o. male, has a history of pain and functional disability in the right hip(s) due to arthritis and patient has failed non-surgical conservative treatments for greater than 12 weeks to include NSAID's and/or analgesics, flexibility and strengthening excercises, supervised PT with diminished ADL's post treatment, use of assistive devices and activity modification.    Subjective Data      Cognition  Cognition Arousal/Alertness: Awake/alert Behavior During Therapy: WFL for tasks assessed/performed Overall Cognitive Status: Within Functional Limits for tasks assessed    Balance  Balance Balance Assessed: Yes Static Standing Balance Static Standing - Balance Support: Bilateral upper extremity supported Static Standing - Level of Assistance: 5: Stand by assistance  End of Session PT - End of Session Equipment Utilized During Treatment: Gait belt Activity Tolerance: Patient tolerated treatment well Patient left: in bed;with call bell/phone within reach;with family/visitor present Nurse  Communication: Mobility status   GP     [name] 09/03/2013, 1:49 PM

## 2013-09-03 NOTE — Progress Notes (Signed)
Occupational Therapy Evaluation Patient Details Name: David Herrera MRN: [id number] DOB: June 03, 1933 Today's Date: 09/03/2013 Time: 5784-6962 OT Time Calculation (min): 15 min  OT Assessment / Plan / Recommendation History of present illness s/p R THA   Clinical Impression   Pt s/p R THA thus affecting PLOF. Pt will benefit from continued OT services to address below problem list in prep for return home.    OT Assessment  Patient needs continued OT Services    Follow Up Recommendations  No OT follow up;Supervision/Assistance - 24 hour    Barriers to Discharge      Equipment Recommendations   (tub DME TBD)    Recommendations for Other Services    Frequency  Min 2X/week    Precautions / Restrictions Precautions Precautions: Posterior Hip Precaution Comments: Reviewed 3/3 posterior hip precautions. Restrictions Weight Bearing Restrictions: Yes RLE Weight Bearing: Weight bearing as tolerated   Pertinent Vitals/Pain See vitals    ADL  Grooming: Performed;Teeth care;Wash/dry face;Supervision/safety;Set up Where Assessed - Grooming: Unsupported standing Upper Body Bathing: Simulated;Set up Where Assessed - Upper Body Bathing: Unsupported sitting Lower Body Bathing: Simulated;Moderate assistance (without AE) Where Assessed - Lower Body Bathing: Unsupported sit to stand Upper Body Dressing: Simulated;Set up Where Assessed - Upper Body Dressing: Unsupported sitting Lower Body Dressing: Simulated;Maximal assistance (without AE) Where Assessed - Lower Body Dressing: Unsupported sit to stand Toilet Transfer: Simulated;Min guard Toilet Transfer Method: Sit to Barista:  (bed<>walked to bathroom<>returned to bed) Equipment Used: Rolling walker;Gait belt Transfers/Ambulation Related to ADLs: min guard with RW ADL Comments: Pt somewhat lethargic from just waking up.  Min verbal cueing to maintain hip precautions during functional mobility.    OT  Diagnosis: Generalized weakness;Acute pain  OT Problem List: Decreased strength;Decreased activity tolerance;Decreased knowledge of use of DME or AE;Decreased knowledge of precautions;Pain OT Treatment Interventions: Self-care/ADL training;DME and/or AE instruction;Therapeutic activities;Patient/family education   OT Goals(Current goals can be found in the care plan section) Acute Rehab OT Goals Patient Stated Goal: To return home  OT Goal Formulation: With patient Time For Goal Achievement: 09/10/13 Potential to Achieve Goals: Good  Visit Information  Last OT Received On: 09/03/13 Assistance Needed: +1 History of Present Illness: s/p R THA       Prior Functioning     Home Living Family/patient expects to be discharged to:: Private residence Living Arrangements: Alone Available Help at Discharge: Personal care attendant;Available 24 hours/day Type of Home: House Home Access: Stairs to enter [employer] of Steps: 2 Home Layout: One level Home Equipment: Walker - 2 wheels;Bedside commode Prior Function Level of Independence: Independent with assistive device(s)         Vision/Perception     Cognition  Cognition Arousal/Alertness: Lethargic (had just woken up) Behavior During Therapy: WFL for tasks assessed/performed Overall Cognitive Status: Within Functional Limits for tasks assessed    Extremity/Trunk Assessment Upper Extremity Assessment Upper Extremity Assessment: Overall WFL for tasks assessed     Mobility Bed Mobility Bed Mobility: Supine to Sit;Sitting - Scoot to Edge of Bed;Sit to Supine Supine to Sit: 4: Min assist;HOB elevated Sitting - Scoot to Edge of Bed: 4: Min guard Sit to Supine: 4: Min guard;HOB flat Details for Bed Mobility Assistance: Assist to support RLE Transfers Transfers: Sit to Stand;Stand to Sit Sit to Stand: 5: Supervision;With upper extremity assist;From bed Stand to Sit: 5: Supervision;To bed;With upper extremity  assist Details for Transfer Assistance: VCs for safe hand placement     Exercise  Balance   End of Session OT - End of Session Equipment Utilized During Treatment: Gait belt;Rolling walker Activity Tolerance: Patient limited by lethargy;Patient limited by fatigue Patient left: in bed;with call bell/phone within reach  GO   09/03/2013 Cipriano Mile OTR/L Pager [phone] Office [phone]   Cipriano Mile 09/03/2013, 5:03 PM

## 2013-09-03 NOTE — Progress Notes (Signed)
UR COMPLETED  

## 2013-09-03 NOTE — Progress Notes (Signed)
Patient was unable to void 6 hours after foley catheter removal.  Patient was bladder scanned with a result of .  Patient was also given an in and out catheter with 600 mL of output.

## 2013-09-03 NOTE — Evaluation (Signed)
Physical Therapy Evaluation Patient Details Name: David Herrera MRN: [id number] DOB: [birth year] Today's Date: 09/03/2013 Time: 4098-1191 PT Time Calculation (min): 28 min  PT Assessment / Plan / Recommendation History of Present Illness  David Herrera, 79 y.o. male, has a history of pain and functional disability in the right hip(s) due to arthritis and patient has failed non-surgical conservative treatments for greater than 12 weeks to include NSAID's and/or analgesics, flexibility and strengthening excercises, supervised PT with diminished ADL's post treatment, use of assistive devices and activity modification.  Clinical Impression  Pt is s/p THA resulting in the deficits listed below (see PT Problem List).  Pt will benefit from skilled PT to increase their independence and safety with mobility to allow discharge to the venue listed below.     PT Assessment  Patient needs continued PT services    Follow Up Recommendations  Home health PT    Does the patient have the potential to tolerate intense rehabilitation      Barriers to Discharge        Equipment Recommendations  None recommended by PT    Recommendations for Other Services     Frequency 7X/week    Precautions / Restrictions Precautions Precautions: Posterior Hip Precaution Booklet Issued: Yes (comment) Precaution Comments: Reviewed THP and provided a handout Restrictions Weight Bearing Restrictions: Yes RLE Weight Bearing: Weight bearing as tolerated Other Position/Activity Restrictions: posterior THP   Pertinent Vitals/Pain       Mobility  Bed Mobility Bed Mobility: Supine to Sit Supine to Sit: 4: Min assist;HOB elevated;With rails Details for Bed Mobility Assistance: cues for technique and assistance with r LE scooting over in bed. Transfers Transfers: Sit to Stand;Stand to Sit Sit to Stand: 4: Min assist;With upper extremity assist Stand to Sit: 4: Min assist;With upper extremity  assist Details for Transfer Assistance: Cues for technique and to maintain THP Ambulation/Gait Ambulation/Gait Assistance: 4: Min guard Ambulation Distance (Feet): 3 Feet Assistive device: Rolling walker Ambulation/Gait Assistance Details: mild c/o lightheadness limiting gait distance Gait Pattern: Step-to pattern;Decreased step length - left Gait velocity: decreased Stairs: No    Exercises Total Joint Exercises Ankle Circles/Pumps: AROM;Strengthening;Both;10 reps;Supine Heel Slides: AAROM;Strengthening;Right;Both;5 reps;Supine Hip ABduction/ADduction: AAROM;Strengthening;Right;Both;5 reps;Supine   PT Diagnosis: Difficulty walking  PT Problem List: Decreased strength;Decreased activity tolerance;Decreased mobility;Decreased knowledge of use of DME;Decreased knowledge of precautions PT Treatment Interventions: DME instruction;Gait training;Stair training;Therapeutic activities;Therapeutic exercise;Balance training;Patient/family education     PT Goals(Current goals can be found in the care plan section) Acute Rehab PT Goals Patient Stated Goal: To return home ambulatory. PT Goal Formulation: With patient Time For Goal Achievement: 09/10/13 Potential to Achieve Goals: Good  Visit Information  Last PT Received On: 09/03/13 Assistance Needed: +1 History of Present Illness: David Herrera, 79 y.o. male, has a history of pain and functional disability in the right hip(s) due to arthritis and patient has failed non-surgical conservative treatments for greater than 12 weeks to include NSAID's and/or analgesics, flexibility and strengthening excercises, supervised PT with diminished ADL's post treatment, use of assistive devices and activity modification.       Prior Functioning  Home Living Family/patient expects to be discharged to:: Private residence Living Arrangements: Alone Available Help at Discharge: Personal care attendant;Available 24 hours/day Type of Home: House Home  Access: Stairs to enter [employer] of Steps: 2 Home Layout: One level Home Equipment: Walker - 2 wheels;Bedside commode Prior Function Level of Independence: Independent with assistive device(s) Communication Communication: No difficulties  Cognition  Cognition Arousal/Alertness: Awake/alert Behavior During Therapy: WFL for tasks assessed/performed Overall Cognitive Status: Within Functional Limits for tasks assessed    Extremity/Trunk Assessment Upper Extremity Assessment Upper Extremity Assessment: Defer to OT evaluation Lower Extremity Assessment Lower Extremity Assessment: RLE deficits/detail RLE Deficits / Details: strength hip 2/5 due to surgical pain Cervical / Trunk Assessment Cervical / Trunk Assessment: Normal   Balance Balance Balance Assessed: Yes Static Standing Balance Static Standing - Balance Support: Bilateral upper extremity supported Static Standing - Level of Assistance: 5: Stand by assistance  End of Session PT - End of Session Equipment Utilized During Treatment: Gait belt Activity Tolerance: Patient tolerated treatment well Patient left: in chair;with call bell/phone within reach Nurse Communication: Mobility status  GP     [name] 09/03/2013, 9:21 AM

## 2013-09-03 NOTE — Progress Notes (Signed)
Patient ID: David Herrera, male   DOB: 23-Jul-1933, 79 y.o.   MRN: [id number]  PROGRESS NOTE  Subjective:  negative for Chest Pain  negative for Shortness of Breath  negative for Nausea/Vomiting   negative for Calf Pain  negative for Bowel Movement, put positive for flatus   Tolerating Diet: yes         Patient reports pain as 2 on 0-10 scale.     Pt is doing very well. He c/o minimal pain and states it is well controlled on current medications.   Objective: Vital signs in last 24 hours:   Patient Vitals for the past 24 hrs:  BP Temp Temp src Pulse Resp SpO2  09/02/13 2013 115/57 mmHg 97.4 F (36.3 C) Oral 90 18 100 %  09/02/13 1840 116/54 mmHg 97.9 F (36.6 C) - 60 16 100 %  09/02/13 1534 104/54 mmHg 97.8 F (36.6 C) - 61 - 100 %  09/02/13 1515 114/59 mmHg - - 66 - 100 %  09/02/13 1500 116/59 mmHg 97.7 F (36.5 C) - 59 16 100 %  09/02/13 1445 112/56 mmHg - - - - -  09/02/13 1430 130/62 mmHg - - 60 17 100 %  09/02/13 1415 134/85 mmHg - - 57 15 100 %  09/02/13 1400 136/59 mmHg - - 54 15 100 %  09/02/13 1345 103/55 mmHg - - 54 20 100 %  09/02/13 1342 98/44 mmHg 97 F (36.1 C) - 53 14 100 %  09/02/13 0916 162/77 mmHg 97 F (36.1 C) Oral 74 20 99 %      Intake/Output from previous day:   09/03 0701 - 09/04 0700 In: 4163.3 [P.O.:840; I.V.:3073.3] Out: 1875 [Urine:1275]   Intake/Output this shift:       Intake/Output     09/03 0701 - 09/04 0700 09/04 0701 - 09/05 0700   P.O. 840    I.V. 3073.3    IV Piggyback 250    Total Intake 4163.3     Urine 1275    Blood 600    Total Output 1875     Net +2288.3             LABORATORY DATA:  Recent Labs  09/03/13 0548  WBC 9.4  HGB 9.3*  HCT 27.2*  PLT 149*    Recent Labs  09/03/13 0548  NA 141  K 4.2  CL 107  CO2 26  BUN 7  CREATININE 0.62  GLUCOSE 150*  CALCIUM 8.2*   Lab Results  Component Value Date   INR 0.96 08/25/2013    Examination:  General appearance: alert, cooperative and no  distress  Wound Exam: dressing in place  Drainage: no drainage noted through dressing  Motor Exam: grossly intact bilateral LE   Sensory Exam: grossly intact bilateral LE   Vascular Exam: Normal  Assessment:    1 Day Post-Op  Procedure(s) (LRB): TOTAL HIP ARTHROPLASTY (Right)  ADDITIONAL DIAGNOSIS:  Active Problems:   * No active hospital problems. *     Plan: Physical Therapy as ordered Weight Bearing as Tolerated (WBAT)  DVT Prophylaxis:  Aspirin, Foot Pumps and TED hose  DISCHARGE PLAN: Skilled Nursing Facility/Rehab  DISCHARGE NEEDS: HHPT, CPM, Walker and 3-in-1 comode seat         Morgen Linebaugh JAMES 09/03/2013, 7:01 AM

## 2013-09-04 LAB — CBC
HCT: 24.9 % — ABNORMAL LOW (ref 39.0–52.0)
MCV: 85.6 fL (ref 78.0–100.0)
RBC: 2.91 MIL/uL — ABNORMAL LOW (ref 4.22–5.81)
RDW: 13.3 % (ref 11.5–15.5)
WBC: 6.7 10*3/uL (ref 4.0–10.5)

## 2013-09-04 LAB — BASIC METABOLIC PANEL
BUN: 7 mg/dL (ref 6–23)
CO2: 27 mEq/L (ref 19–32)
Chloride: 104 mEq/L (ref 96–112)
Creatinine, Ser: 0.59 mg/dL (ref 0.50–1.35)

## 2013-09-04 MED ORDER — ASPIRIN 325 MG PO TBEC: 325.0000 mg | DELAYED_RELEASE_TABLET | Freq: Every day | ORAL | Status: DC

## 2013-09-04 MED ORDER — BACLOFEN 10 MG PO TABS: 10.0000 mg | ORAL_TABLET | Freq: Three times a day (TID) | ORAL | Status: DC

## 2013-09-04 MED ORDER — OXYCODONE-ACETAMINOPHEN 5-325 MG PO TABS: 1.0000 | ORAL_TABLET | ORAL | Status: DC | PRN

## 2013-09-04 NOTE — Discharge Summary (Signed)
PATIENT ID: David Herrera        MRN:  [id number]          DOB/AGE: 1933-05-15 / 79 y.o.    DISCHARGE SUMMARY  ADMISSION DATE:    09/02/2013 DISCHARGE DATE:   09/04/2013   ADMISSION DIAGNOSIS: DJD RIGHT HIP    DISCHARGE DIAGNOSIS:  DJD RIGHT HIP    ADDITIONAL DIAGNOSIS: Active Problems:   * No active hospital problems. *  Past Medical History  Diagnosis Date  . Hypertension   . DJD (degenerative joint disease) of hip     right    PROCEDURE: Procedure(s): TOTAL HIP ARTHROPLASTY Right on 09/02/2013  CONSULTS: Pt/OT, Care Mngmt  HISTORY: See H&P in chart  HOSPITAL COURSE:  David Herrera is a 79 y.o. admitted on 09/02/2013 and found to have a diagnosis of DJD RIGHT HIP.  After appropriate laboratory studies were obtained  they were taken to the operating room on 09/02/2013 and underwent  Procedure(s): TOTAL HIP ARTHROPLASTY  Right.   They were given perioperative antibiotics:  Anti-infectives   Start     Dose/Rate Route Frequency Ordered Stop   09/02/13 1600  ceFAZolin (ANCEF) IVPB 1 g/50 mL premix     1 g 100 mL/hr over 30 Minutes Intravenous Every 6 hours 09/02/13 1528 09/02/13 2222   09/02/13 0600  ceFAZolin (ANCEF) IVPB 2 g/50 mL premix     2 g 100 mL/hr over 30 Minutes Intravenous On call to O.R. 09/01/13 1456 09/02/13 1134    .  Tolerated the procedure well.  Placed with a foley intraoperatively.    POD #1, allowed out of bed to a chair.  PT for ambulation and exercise program.  Foley D/C'd in morning.  IV saline locked.  O2 discontionued. Hemovac pulled.   POD #2, continued PT and ambulation.  The remainder of the hospital course was dedicated to ambulation and strengthening.   The patient was discharged on 2 Days Post-Op in  Stable condition.  Blood products given:none  DIAGNOSTIC STUDIES: Recent vital signs: Patient Vitals for the past 24 hrs:  BP Temp Pulse Resp SpO2  09/04/13 0535 118/55 mmHg 97.9 F (36.6 C) 84 16 98 %  09/04/13 0400 - - - 16 97  %  09/04/13 0000 - - - 16 98 %  09/03/13 2138 130/91 mmHg 99.1 F (37.3 C) 80 16 98 %  09/03/13 2000 - - - 18 -  09/03/13 1600 - - - 16 98 %  09/03/13 1246 109/59 mmHg 98.8 F (37.1 C) 82 18 97 %  09/03/13 1200 - - - 17 98 %  09/03/13 1044 122/54 mmHg - 69 - -  09/03/13 0908 - - - - 98 %  09/03/13 0800 - - - 17 98 %       Recent laboratory studies:  Recent Labs  09/03/13 0548  WBC 9.4  HGB 9.3*  HCT 27.2*  PLT 149*    Recent Labs  09/03/13 0548  NA 141  K 4.2  CL 107  CO2 26  BUN 7  CREATININE 0.62  GLUCOSE 150*  CALCIUM 8.2*   Lab Results  Component Value Date   INR 0.96 08/25/2013     Recent Radiographic Studies :  Dg Chest 2 View  08/25/2013   *RADIOLOGY REPORT*  Clinical Data: Preadmit right hip replacement  CHEST - 2 VIEW  Comparison: 03/16/2011  Findings: Heart size and vascularity are normal.  The lungs are clear without infiltrate effusion or mass.  Mild apical pleural scarring bilaterally.  IMPRESSION: No active cardiopulmonary abnormality and no interval change.   Original Report Authenticated By: Janeece Riggers, M.D.   Dg Pelvis Portable  09/02/2013   *RADIOLOGY REPORT*  Clinical Data: Post right total hip replacement  PORTABLE PELVIS  Comparison: Portable exam at 1429 hours compared to 02/25/2013  Findings: Acetabular femoral components of the a right hip prosthesis are identified. Cerclage wire present at proximal right femur. No acute fracture or dislocation. Bones appear demineralized. Pelvis appears intact with narrowing of left hip joint noted. Scattered vascular calcifications.  IMPRESSION: Right hip prosthesis without acute abnormality. Osseous demineralization.   Original Report Authenticated By: Ulyses Southward, M.D.    DISCHARGE INSTRUCTIONS: Discharge Orders   Future Orders Complete By Expires   Call MD / Call 911  As directed    Comments:     If you experience chest pain or shortness of breath, CALL 911 and be transported to the hospital emergency  room.  If you develope a fever above 101 F, pus (white drainage) or increased drainage or redness at the wound, or calf pain, call your surgeon's office.   Change dressing  As directed    Comments:     change the dressing daily with sterile 4 x 4 inch gauze dressing and paper tape.  You may clean the incision with alcohol prior to redressing   Constipation Prevention  As directed    Comments:     Drink plenty of fluids.  Prune juice may be helpful.  You may use a stool softener, such as Colace (over the counter) 100 mg twice a day.  Use MiraLax (over the counter) for constipation as needed.   Diet - low sodium heart healthy  As directed    Follow the hip precautions as taught in Physical Therapy  As directed    Increase activity slowly as tolerated  As directed    Patient may shower  As directed    Comments:     You may shower without a dressing once there is no drainage x 2 days. If drainage remains, cover wound with plastic wrap and then shower.   TED hose  As directed    Comments:     Use stockings (TED hose) for 2 weeks on both leg(s).  You may remove them at night for sleeping.   Weight bearing as tolerated  As directed       DISCHARGE MEDICATIONS:     Medication List         aspirin 325 MG EC tablet  Take 1 tablet (325 mg total) by mouth daily.     baclofen 10 MG tablet  Commonly known as:  LIORESAL  Take 1 tablet (10 mg total) by mouth 3 (three) times daily.     diclofenac 75 MG EC tablet  Commonly known as:  VOLTAREN  Take 75 mg by mouth 2 (two) times daily.     GLUCOSAMINE PO  Take 2,000 mg by mouth daily.     hydrochlorothiazide 25 MG tablet  Commonly known as:  HYDRODIURIL  Take 25 mg by mouth daily.     multivitamin with minerals Tabs tablet  Take 1 tablet by mouth daily.     oxyCODONE-acetaminophen 5-325 MG per tablet  Commonly known as:  PERCOCET/ROXICET  Take 1-2 tablets by mouth every 4 (four) hours as needed for pain.     simvastatin 20 MG tablet   Commonly known as:  ZOCOR  Take 20 mg by mouth  every evening.     Vitamin D 2000 UNITS Caps  Take 2,000 Units by mouth 2 (two) times daily.        FOLLOW UP VISIT:       Follow-up Information   Follow up with Loreta Ave, MD. (as scheduled 2 weeks post-op)    Specialty:  Orthopedic Surgery   Contact information:   [address] [place] [address] [phone]       DISPOSITION:   Home with HH  CONDITION:  Stable   Satsuki Zillmer JAMES 09/04/2013, 7:24 AM

## 2013-09-04 NOTE — Progress Notes (Signed)
Patient ID: David Herrera, male   DOB: 07/02/1933, 79 y.o.   MRN: [id number]  PROGRESS NOTE  Subjective:  negative for Chest Pain  negative for Shortness of Breath  negative for Nausea/Vomiting   negative for Calf Pain  negative for Bowel Movement   Tolerating Diet: yes         Patient reports pain as 0 on 0-10 scale.     Pt doing well. Only c/o of minimal pain with ambulation. No pain at rest. Pt would like to go home.  Objective: Vital signs in last 24 hours:   Patient Vitals for the past 24 hrs:  BP Temp Pulse Resp SpO2  09/04/13 0535 118/55 mmHg 97.9 F (36.6 C) 84 16 98 %  09/04/13 0400 - - - 16 97 %  09/04/13 0000 - - - 16 98 %  09/03/13 2138 130/91 mmHg 99.1 F (37.3 C) 80 16 98 %  09/03/13 2000 - - - 18 -  09/03/13 1600 - - - 16 98 %  09/03/13 1246 109/59 mmHg 98.8 F (37.1 C) 82 18 97 %  09/03/13 1200 - - - 17 98 %  09/03/13 1044 122/54 mmHg - 69 - -  09/03/13 0908 - - - - 98 %  09/03/13 0800 - - - 17 98 %      Intake/Output from previous day:   09/04 0701 - 09/05 0700 In: 720 [P.O.:720] Out: 600 [Urine:600]   Intake/Output this shift:       Intake/Output     09/04 0701 - 09/05 0700 09/05 0701 - 09/06 0700   P.O. 720    I.V.     IV Piggyback     Total Intake 720     Urine 600    Blood     Total Output 600     Net +120             LABORATORY DATA:  Recent Labs  09/03/13 0548  WBC 9.4  HGB 9.3*  HCT 27.2*  PLT 149*    Recent Labs  09/03/13 0548  NA 141  K 4.2  CL 107  CO2 26  BUN 7  CREATININE 0.62  GLUCOSE 150*  CALCIUM 8.2*   Lab Results  Component Value Date   INR 0.96 08/25/2013    Examination:  General appearance: alert, cooperative and no distress  Wound Exam: clean, dry, intact   Drainage:  None: wound tissue dry  Motor Exam: grossly intact bilateral LE  Sensory Exam: grossly intact bilateral LE  Vascular Exam: Normal  Assessment:    2 Days Post-Op  Procedure(s) (LRB): TOTAL HIP ARTHROPLASTY  (Right)  ADDITIONAL DIAGNOSIS:  Active Problems:   * No active hospital problems. *     Plan: Physical Therapy as ordered Weight Bearing as Tolerated (WBAT)  DVT Prophylaxis:  Aspirin, Foot Pumps and TED hose  DISCHARGE PLAN: Home with HH today  DISCHARGE NEEDS: HHPT, CPM, Walker and 3-in-1 comode seat         Smitty Ackerley JAMES 09/04/2013, 7:06 AM

## 2013-09-04 NOTE — Progress Notes (Signed)
Patient discharged to home accompanied by family. Discharge instructions and rx given and explained and patient stated understanding. IV was removed. Patient left unit in a stable condition via wheelchair. 

## 2013-09-04 NOTE — Progress Notes (Signed)
Physical Therapy Treatment Patient Details Name: David Herrera MRN: [id number] DOB: 02-Dec-1933 Today's Date: 09/04/2013 Time: 4098-1191 PT Time Calculation (min): 22 min  PT Assessment / Plan / Recommendation  History of Present Illness 79 y.o. male admitted to [facility] on 09/02/13 for elective THA R (posterior approach) WBAT.     PT Comments   Pt is progressing very well with his mobility and is now electing to go home with HHPT and his "lady friend" to help him at home.  He is borrowing all the equipment he will need and has successfully completed the stairs this AM with PT.    Follow Up Recommendations  Home health PT;Supervision for mobility/OOB     Does the patient have the potential to tolerate intense rehabilitation    NA  Barriers to Discharge   None      Equipment Recommendations  None recommended by PT (pt borrowing all equipment from a friend)    Recommendations for Other Services   None  Frequency 7X/week   Progress towards PT Goals Progress towards PT goals: Progressing toward goals  Plan Current plan remains appropriate    Precautions / Restrictions Precautions Precautions: Posterior Hip Precaution Comments: pt able to report 2/3 hip precautions at beginning of session and 3/3 at end of session.   Restrictions Weight Bearing Restrictions: Yes RLE Weight Bearing: Weight bearing as tolerated   Pertinent Vitals/Pain See vitals flow sheet.    Mobility  Bed Mobility Supine to Sit: 6: Modified independent (Device/Increase time);With rails;HOB elevated Sitting - Scoot to Edge of Bed: 6: Modified independent (Device/Increase time);With rail Details for Bed Mobility Assistance: pt using rail to get to EOB.  He is able to progress bil legs to side of bed on his own.   Transfers Transfers: Sit to Stand;Stand to Sit Sit to Stand: 5: Supervision;With upper extremity assist;From bed Stand to Sit: 5: Supervision;With upper extremity assist;With armrests;To  chair/3-in-1 Details for Transfer Assistance: supervision for safety, verbal cues for safe hand placement.   Ambulation/Gait Ambulation/Gait Assistance: 5: Supervision Ambulation Distance (Feet): 120 Feet Assistive device: Rolling walker Ambulation/Gait Assistance Details: supervision for safety, verbal cues for upright posture, right foot posture.  Pt reporting RW safety and leg sequencing independently.  Gait Pattern: Step-through pattern;Antalgic;Trunk flexed (right leg internally rotated with gait and at rest) General Gait Details: verbal cues for upright posture and to relax his shoulders during gait.  Stairs: Yes Stairs Assistance: 4: Min assist Stairs Assistance Details (indicate cue type and reason): min hand held assist since pt is unable to reach both of his railings at home to get into the house.  Verbal cues for safest/least painful sequence of legs.  Also practiced a curb step because he has that once on the pourch to get into the house Stair Management Technique: One rail Right;Forwards;Other (comment);With walker (with hand held assist on left) Number of Stairs: 2      PT Goals (current goals can now be found in the care plan section) Acute Rehab PT Goals Patient Stated Goal: To return home   Visit Information  Last PT Received On: 09/04/13 Assistance Needed: +1 History of Present Illness: 79 y.o. male admitted to [facility] on 09/02/13 for elective THA R (posterior approach) WBAT.      Subjective Data  Subjective: Pt reports he really doesn't have any pain right now in his right hip Patient Stated Goal: To return home    Cognition  Cognition Arousal/Alertness: Awake/alert Behavior During Therapy: WFL for tasks assessed/performed Overall  Cognitive Status: Within Functional Limits for tasks assessed       End of Session PT - End of Session Activity Tolerance: Patient tolerated treatment well Patient left: in chair;with call bell/phone within reach;with family/visitor  present Nurse Communication: Other (comment) (pt still getting liquid tray at lunch)     Lurena Joiner B. Nashia Remus, PT, DPT [phone]   09/04/2013, 12:03 PM

## 2013-09-04 NOTE — Progress Notes (Signed)
   CARE MANAGEMENT NOTE 09/04/2013  Patient:  David Herrera, David Herrera   Account Number:  [id number]  Date Initiated:  09/03/2013  Documentation initiated by:  [facility]  Subjective/Objective Assessment:   admitted postop rt total hip     Action/Plan:   Pt/OT evals- recommended HHPT and HHOT   Anticipated DC Date:  09/04/2013   Anticipated DC Plan:  HOME W HOME HEALTH SERVICES      DC Planning Services  CM consult      Choice offered to / List presented to:  C-1 Patient        HH arranged  HH-2 PT  HH-3 OT      [facility] agency  [facility]   Status of service:  Completed, signed off Medicare Important Message given?   (If response is "NO", the following Medicare IM given date fields will be blank) Date Medicare IM given:   Date Additional Medicare IM given:    Discharge Disposition:  HOME W HOME HEALTH SERVICES  Per UR Regulation:    If discussed at Long Length of Stay Meetings, dates discussed:    Comments:  09/04/2013  [address] RN, [place]  [phone] Met with patient regarding choice for home health agency. He selected [facility]. [facility]/ Debbie called with referral for HH: PT, OT   09/03/13 Spoke with patient about HHC and gave him [facility]. He wanted to review the list with family members. CM will follow with patient. Jacquelynn Cree RN, BSN, CCM

## 2013-09-22 ENCOUNTER — Ambulatory Visit (HOSPITAL_COMMUNITY)
Admission: RE | Admit: 2013-09-22 | Discharge: 2013-09-22 | Disposition: A | Discharge status: Home / Self Care | Payer: Medicare Other | Source: Ambulatory Visit | Attending: Orthopedic Surgery | Admitting: Orthopedic Surgery

## 2013-09-22 ENCOUNTER — Ambulatory Visit (HOSPITAL_COMMUNITY): Payer: Self-pay | Admitting: Physical Therapy

## 2013-09-22 DIAGNOSIS — R262 Difficulty in walking, not elsewhere classified: Secondary | ICD-10-CM | POA: Insufficient documentation

## 2013-09-22 DIAGNOSIS — IMO0001 Reserved for inherently not codable concepts without codable children: Secondary | ICD-10-CM | POA: Insufficient documentation

## 2013-09-22 DIAGNOSIS — R29898 Other symptoms and signs involving the musculoskeletal system: Secondary | ICD-10-CM | POA: Insufficient documentation

## 2013-09-22 DIAGNOSIS — R2689 Other abnormalities of gait and mobility: Secondary | ICD-10-CM | POA: Insufficient documentation

## 2013-09-22 DIAGNOSIS — I10 Essential (primary) hypertension: Secondary | ICD-10-CM | POA: Insufficient documentation

## 2013-09-22 DIAGNOSIS — M25559 Pain in unspecified hip: Secondary | ICD-10-CM | POA: Insufficient documentation

## 2013-09-22 NOTE — Evaluation (Addendum)
Physical Therapy Evaluation  Patient Details  Name: David Herrera MRN: [id number] Date of Birth: 1933/10/03  Today's Date: 09/22/2013 Time: 1027-2536 PT Time Calculation (min): 37 min Charge evaluation             Visit#: 1 of 8  Re-eval: 10/22/13 Assessment Diagnosis: Rt THP  Authorization: [employer] medicare    Authorization Visit#: 1 of 8   Past Medical History:  Past Medical History  Diagnosis Date  . Hypertension   . DJD (degenerative joint disease) of hip     right   Past Surgical History:  Past Surgical History  Procedure Laterality Date  . No past surgeries    . Total hip arthroplasty Right 09/02/2013    Procedure: TOTAL HIP ARTHROPLASTY;  Surgeon: Loreta Ave, MD;  Location: [facility] OR;  Service: Orthopedics;  Laterality: Right;    Subjective Symptoms/Limitations Symptoms: David Herrera states that he had a THR on 09/02/2013.  He was discharged on 09/05/2013 to [facility].  He is now being referred to OP therapy to return him to maximal functional level. How long can you sit comfortably?: Able to sit for an hour without difficulty How long can you stand comfortably?: Able to stand for five minues How long can you walk comfortably?: Pt is now walking with a cane; the longest he has walked for 10 minutes  Patient Stated Goals: be able to walk without the cane; pt would like to dance; go up and down steps reciprocally; golf  Pain Assessment Currently in Pain?: No/denies Pain Location: Hip Pain Orientation: Right Pain Type: Surgical pain Pain Onset: 1 to 4 weeks ago Pain Frequency: Intermittent Pain Relieving Factors: no need Effect of Pain on Daily Activities: no difficulty    Assessment RLE Strength Right Hip Flexion: 5/5 Right Hip Extension: 3/5 Right Hip ABduction: 3-/5 Right Knee Flexion: 4/5 Right Knee Extension: 5/5 Right Ankle Dorsiflexion: 3+/5   ABC confidence score 49% Exercise/Treatments Mobility/Balance  Static Standing Balance Single Leg Stance - Right  Leg: 1 Single Leg Stance - Left Leg: 3    Aerobic Stationary Bike: Nustep L 3 x 5 min. hills   Standing Heel Raises: 10 reps SLS:  (x3 max 5 sec) Sidelying Hip ABduction: 10 reps Prone  Hamstring Curl: 5 reps Hip Extension: 5 reps     Physical Therapy Assessment and Plan PT Assessment and Plan Clinical Impression Statement: Pt s/p Rt THR three weeks ago who upon examination demonstrates decreased balance, decreased strength and decreased activity tolerance.  PT will benefit from skilled PT to maximize pt full functional potential. Pt will benefit from skilled therapeutic intervention in order to improve on the following deficits: Decreased balance;Decreased activity tolerance;Difficulty walking;Pain;Decreased strength Rehab Potential: Good PT Frequency: Min 2X/week PT Duration: 4 weeks PT Treatment/Interventions: Gait training;Stair training;Therapeutic activities;Therapeutic exercise PT Plan: begin rockerboard, lateral and forward step up, terminal knee extension, retro walking,  gait trainer Rt LE is 90 cm ; Lt is 92.  Progress balance, gt and strengthening    Goals Home Exercise Program PT Goal: Perform Home Exercise Program - Progress: Goal set today PT Short Term Goals Time to Complete Short Term Goals: 2 weeks PT Short Term Goal 1: Pt to be walking inside without  a cane PT Short Term Goal 2: Pt to be walking for at least 20 minutes without stopping PT Short Term Goal 3: Pt pain to be no greater than a 2  PT Short Term Goal 4: Pt to be able to SLS x 10  seconds for improved safety PT Long Term Goals Time to Complete Long Term Goals: 4 weeks PT Long Term Goal 1: I in advance HEP PT Long Term Goal 2: Ambulating outside without a cane Long Term Goal 3: strength improved one grade to allow reciprocal stair climbing Long Term Goal 4: Pt to be able to walk for at least 60 mintues without stopping PT Long Term Goal 5: Pt to have gone dancing  Problem List Patient Active  Problem List   Diagnosis Date Noted  . Difficulty in walking(719.7) 09/22/2013  . Unstable balance 09/22/2013  . Weakness of right leg 09/22/2013  . Hypertension   . DJD (degenerative joint disease) of hip     PT - End of Session Equipment Utilized During Treatment: Gait belt Activity Tolerance: Patient tolerated treatment well General Behavior During Therapy: WFL for tasks assessed/performed PT Plan of Care PT Home Exercise Plan: given  GP Functional Assessment Tool Used: clinical judgement ABC confidence Functional Limitation: Mobility: Walking and moving around Mobility: Walking and Moving Around Current Status (Z6109): At least 40 percent but less than 60 percent impaired, limited or restricted Mobility: Walking and Moving Around Goal Status [phone]): At least 1 percent but less than 20 percent impaired, limited or restricted  David Herrera 09/22/2013, 2:04 PM  Physician Documentation Your signature is required to indicate approval of the treatment plan as stated above.  Please sign and either send electronically or make a copy of this report for your files and return this physician signed original.   Please mark one 1.__approve of plan  2. ___approve of plan with the following conditions.   ______________________________                                                          _____________________ Physician Signature                                                                                                             Date

## 2013-09-23 NOTE — Progress Notes (Signed)
Late Entry: SW received a consult for possible placement. PT  At this time is recommending home with HH and not SNF.  Clinical Social Worker will sign off for now as social work intervention is no longer needed. Please consult us again if new need arises.   Allison Deshotels, MSW, LCSWA [phone] 

## 2013-09-24 ENCOUNTER — Ambulatory Visit (HOSPITAL_COMMUNITY)
Admission: RE | Admit: 2013-09-24 | Discharge: 2013-09-24 | Disposition: A | Discharge status: Home / Self Care | Payer: Medicare Other | Source: Ambulatory Visit | Attending: Internal Medicine | Admitting: Internal Medicine

## 2013-09-24 NOTE — Progress Notes (Signed)
Physical Therapy Treatment Patient Details  Name: David Herrera MRN: [id number] Date of Birth: 1933/08/08  Today's Date: 09/24/2013 Time: 7846-9629 PT Time Calculation (min): 44 min  Visit#: 2 of 8  Re-eval: 10/22/13 Authorization: [employer] medicare  Authorization Visit#: 2 of 8  Charges:  therex 42'  Subjective: Symptoms/Limitations Symptoms: Pt states he really doesnt have any pain in his Rt hip.  STates he has little aches and pains all over though from arthritis. Pain Assessment Currently in Pain?: No/denies   Exercise/Treatments Aerobic Stationary Bike: Nustep L 3 hills #3 seat 10  x 8 min Standing Heel Raises: 10 reps;Limitations Heel Raises Limitations: toeraises 10 reps Lateral Step Up: 10 reps;Hand Hold: 1;Step Height: 4";Right Forward Step Up: 10 reps;Right;Hand Hold: 1;Step Height: 4" Gait Training: heel toe with SPC Other Standing Knee Exercises: retro ambulation 2RT, side stepping 1RT Supine Bridges: 15 reps Straight Leg Raises: Both;10 reps Sidelying Hip ABduction: 15 reps Prone  Hamstring Curl: 10 reps Hip Extension: 10 reps    Physical Therapy Assessment and Plan PT Assessment and Plan Clinical Impression Statement: Added new therex per POC and reviewed HEP.  Pt able to complete retro gait without LOB, however required cues to increase step length bilaterally.  Required UE assist with all standing activities today. PT Plan: begin terminal knee extension and gait trainer to normalize gait.  Rt LE is 90 cm ; Lt is 92.  Progress balance, gt and strengthening     Problem List Patient Active Problem List   Diagnosis Date Noted  . Difficulty in walking(719.7) 09/22/2013  . Unstable balance 09/22/2013  . Weakness of right leg 09/22/2013  . Hypertension   . DJD (degenerative joint disease) of hip     PT - End of Session Equipment Utilized During Treatment: Gait belt Activity Tolerance: Patient tolerated treatment well General Behavior During  Therapy: WFL for tasks assessed/performed   Lurena Nida, PTA/CLT 09/24/2013, 10:29 AM

## 2013-09-29 ENCOUNTER — Ambulatory Visit (HOSPITAL_COMMUNITY)
Admission: RE | Admit: 2013-09-29 | Discharge: 2013-09-29 | Disposition: A | Discharge status: Home / Self Care | Payer: Medicare Other | Source: Ambulatory Visit | Attending: Internal Medicine | Admitting: Internal Medicine

## 2013-09-29 DIAGNOSIS — R262 Difficulty in walking, not elsewhere classified: Secondary | ICD-10-CM

## 2013-09-29 DIAGNOSIS — R29898 Other symptoms and signs involving the musculoskeletal system: Secondary | ICD-10-CM

## 2013-09-29 DIAGNOSIS — R2689 Other abnormalities of gait and mobility: Secondary | ICD-10-CM

## 2013-09-29 NOTE — Progress Notes (Signed)
Physical Therapy Treatment Patient Details  Name: David Herrera MRN: [id number] Date of Birth: Jan 16, 1933  Today's Date: 09/29/2013 Time: 1340-1430 PT Time Calculation (min): 50 min Charge: Gait 1340-1348, TE 4098-1191  Visit#: 3 of 8  Re-eval: 10/22/13 Assessment Diagnosis: Rt THP Surgical Date: 09/02/13 Next MD Visit: Mckinley Jewel October  Authorization: [facility] medicare  Authorization Time Period:    Authorization Visit#: 3 of 8   Subjective: Symptoms/Limitations Symptoms: Pt stated he is pain free today.  Reports complaince with HEP, most difficulty with the SLS at home.   Pain Assessment Currently in Pain?: No/denies  Precautions/Restrictions  Precautions Precautions: Posterior Hip;Other (comment) (Lateral)  Exercise/Treatments Aerobic Stationary Bike: Nustep L 3 hills #3 seat 10  x 10 min SPM>100 Standing Heel Raises: 15 reps;Limitations Heel Raises Limitations: toeraises 15 reps Terminal Knee Extension: Right;10 reps;Theraband Theraband Level (Terminal Knee Extension): Level 4 (Blue) Lateral Step Up: Right;15 reps;Hand Hold: 2;Step Height: 4" Forward Step Up: Right;5 reps;Hand Hold: 1;Step Height: 4";10 reps;Step Height: 6" Step Down: Right;10 reps;Hand Hold: 1;Step Height: 4" Functional Squat: 5 reps;Limitations Functional Squat Limitations: therapist facilitation Rocker Board: 2 minutes;Limitations Rocker Board Limitations: R/L with therapist facilitation SLS: L 10", R Gait Training: Gait training with and without SPC, cueing for heel to toe pattern Prone  Hip Extension: 15 reps      Physical Therapy Assessment and Plan PT Assessment and Plan Clinical Impression Statement: Added functional strengthening exercises and gait training to improve mechanics.  Pt able to follow vc-ing for heel to toe pattern with improved gait mechanics.  Noted decreased gluteal strengthen affecting gait mechanics.  Began TKE and step down for quad activation and  strengthening with min cueing required for form and technique.  Pt reported no increase in pain through session.   PT Plan: Begin gait trainer on TM to normalize gait.  Rt LE is 90 cm ; Lt is 92.  Progress balance, gt and strengthening    Goals PT Short Term Goals Time to Complete Short Term Goals: 2 weeks PT Short Term Goal 1: Pt to be walking inside without  a cane PT Short Term Goal 1 - Progress: Progressing toward goal PT Short Term Goal 2: Pt to be walking for at least 20 minutes without stopping PT Short Term Goal 3: Pt pain to be no greater than a 2  PT Short Term Goal 3 - Progress: Progressing toward goal PT Short Term Goal 4: Pt to be able to SLS x 10 seconds for improved safety PT Short Term Goal 4 - Progress: Progressing toward goal PT Long Term Goals Time to Complete Long Term Goals: 4 weeks PT Long Term Goal 1: I in advance HEP PT Long Term Goal 2: Ambulating outside without a cane Long Term Goal 3: strength improved one grade to allow reciprocal stair climbing Long Term Goal 3 Progress: Progressing toward goal Long Term Goal 4: Pt to be able to walk for at least 60 mintues without stopping PT Long Term Goal 5: Pt to have gone dancing  Problem List Patient Active Problem List   Diagnosis Date Noted  . Difficulty in walking(719.7) 09/22/2013  . Unstable balance 09/22/2013  . Weakness of right leg 09/22/2013  . Hypertension   . DJD (degenerative joint disease) of hip     PT - End of Session Equipment Utilized During Treatment: Gait belt Activity Tolerance: Patient tolerated treatment well General Behavior During Therapy: [facility] for tasks assessed/performed  GP    Juel Burrow 09/29/2013, 2:36  PM

## 2013-10-01 ENCOUNTER — Ambulatory Visit (HOSPITAL_COMMUNITY)
Admission: RE | Admit: 2013-10-01 | Discharge: 2013-10-01 | Disposition: A | Discharge status: Home / Self Care | Payer: Medicare Other | Source: Ambulatory Visit | Attending: Orthopedic Surgery | Admitting: Orthopedic Surgery

## 2013-10-01 DIAGNOSIS — R29898 Other symptoms and signs involving the musculoskeletal system: Secondary | ICD-10-CM

## 2013-10-01 DIAGNOSIS — R262 Difficulty in walking, not elsewhere classified: Secondary | ICD-10-CM

## 2013-10-01 DIAGNOSIS — R2689 Other abnormalities of gait and mobility: Secondary | ICD-10-CM

## 2013-10-01 DIAGNOSIS — I10 Essential (primary) hypertension: Secondary | ICD-10-CM | POA: Insufficient documentation

## 2013-10-01 DIAGNOSIS — M25559 Pain in unspecified hip: Secondary | ICD-10-CM | POA: Insufficient documentation

## 2013-10-01 DIAGNOSIS — IMO0001 Reserved for inherently not codable concepts without codable children: Secondary | ICD-10-CM | POA: Insufficient documentation

## 2013-10-01 NOTE — Progress Notes (Signed)
Physical Therapy Treatment Patient Details  Name: David Herrera MRN: [id number] Date of Birth: [birth year]  Today's Date: 10/01/2013 Time: 9147-8295 PT Time Calculation (min): 54 min Charge:  Gt training 6213-0865: there ex 7846-9629 Visit#: 4 of 8  Re-eval: 10/22/13   Authorization: [employer] medicare   Authorization Visit#: 4 of 8   Subjective: Symptoms/Limitations Symptoms: Pt states that he is doing well with HEP; mm sore after therapy but better by the next day Pain Assessment Currently in Pain?: No/denies    Exercise/Treatments     Aerobic Stationary Bike: Nustep L 3 hills #3 seat 10  x 10 min SPM>100 Tread Mill: gt trainer R 90; L 92 x 10:00 at .40 cycle/sec.   Standing Heel Raises: 10 reps;Limitations Heel Raises Limitations: down on  Rt  Terminal Knee Extension: 15 reps Theraband Level (Terminal Knee Extension): Level 4 (Blue) Lateral Step Up: 15 reps;Hand Hold: 1 Forward Step Up: Right;5 reps;10 reps;Hand Hold: 1;Step Height: 4" Step Down: Right;10 reps;Hand Hold: 1;Step Height: 4" Functional Squat: 5 reps;Limitations Functional Squat Limitations: therapist facilitation Rocker Board: 2 minutes;Limitations Rocker Board Limitations: R/L with therapist facilitation Gait Training: Gait training with and without SPC, cueing for heel to toe pattern   Supine Bridges: 15 reps;Limitations Bridges Limitations: slow hold for 5 sec. Sidelying Hip ABduction: 15 reps;Limitations Hip ABduction Limitations: slow hold for 5 seconds Prone  Hip Extension: 15 reps;Limitations Hip Extension Limitations: slow hold for 5 seconds    Physical Therapy Assessment and Plan PT Assessment and Plan Clinical Impression Statement: Attempted to add wall squats but OA in Rt knee prohibited this; Pt improving overall in strength and balance.  Added gt trainer with emphasis on increasing stride lengh. PT Plan: begin steps in department    Goals  progressing  Problem List Patient  Active Problem List   Diagnosis Date Noted  . Difficulty in walking(719.7) 09/22/2013  . Unstable balance 09/22/2013  . Weakness of right leg 09/22/2013  . Hypertension   . DJD (degenerative joint disease) of hip        GP    David Herrera,David Herrera 10/01/2013, 3:11 PM

## 2013-10-06 ENCOUNTER — Ambulatory Visit (HOSPITAL_COMMUNITY)
Admission: RE | Admit: 2013-10-06 | Discharge: 2013-10-06 | Disposition: A | Discharge status: Home / Self Care | Payer: Medicare Other | Source: Ambulatory Visit | Attending: Internal Medicine | Admitting: Internal Medicine

## 2013-10-06 NOTE — Progress Notes (Addendum)
Physical Therapy Treatment Patient Details  Name: David Herrera MRN: [id number] Date of Birth: [birth year]  Today's Date: 10/06/2013 Time: 1300-1352 PT Time Calculation (min): 52 min  Visit#: 5 of 8  Re-eval: 10/22/13 Authorization: [employer] medicare  Authorization Time Period:    Authorization Visit#: 5 of 8  Charges:  therex  1300-1335 (35'), gait 1337-1352 (15')  Subjective:  Pt reports compliance with HEP.  Some discomfort in lumbar spine, however can tell he's improving.    Exercise/Treatments Aerobic Stationary Bike: Nustep L 3 hills #3 seat 10  x 10 min SPM>100 Tread Mill: gt trainer R 90; L 92 x 8:00 at .43 cycle/sec. Standing Heel Raises: 10 reps Heel Raises Limitations: Rt only with fingertip assist Functional Squat: 5 reps;Limitations Functional Squat Limitations: therapist facilitation Stairs: 4Rt in Theme park manager Board: 2 minutes;Limitations Rocker Board Limitations: R/L with therapist facilitation SLS: L 20", R 7" max of 3 Gait Training: Gait training with and without SPC, cueing for heel to toe pattern Supine Bridges: 15 reps;Limitations Bridges Limitations: slow hold for 5 sec. Straight Leg Raises: Both;10 reps Sidelying Hip ABduction: 15 reps;Limitations Hip ABduction Limitations: slow hold for 5 seconds Prone  Hamstring Curl: 15 reps;Limitations Hamstring Curl Limitations: Right Hip Extension: 15 reps;Right Hip Extension Limitations: slow hold for 5 seconds     Physical Therapy Assessment and Plan PT Assessment and Plan Clinical Impression Statement: Pt contines to have difficulty with squats due to OA in knees; overall improved gait quality with equalizing stride length.  Progressed to reciprocal stairs in dept today without c/o pain or difficulty. PT Plan: Attempt stairwell next visit.  Continue to improve gait and overall stability.    Problem List Patient Active Problem List   Diagnosis Date Noted  . Difficulty in walking(719.7) 09/22/2013  .  Unstable balance 09/22/2013  . Weakness of right leg 09/22/2013  . Hypertension   . DJD (degenerative joint disease) of hip       Lurena Nida, PTA/CLT 10/06/2013, 2:48 PM

## 2013-10-08 ENCOUNTER — Ambulatory Visit (HOSPITAL_COMMUNITY)
Admission: RE | Admit: 2013-10-08 | Discharge: 2013-10-08 | Disposition: A | Discharge status: Home / Self Care | Payer: Medicare Other | Source: Ambulatory Visit | Attending: Internal Medicine | Admitting: Internal Medicine

## 2013-10-08 DIAGNOSIS — R29898 Other symptoms and signs involving the musculoskeletal system: Secondary | ICD-10-CM

## 2013-10-08 DIAGNOSIS — R2689 Other abnormalities of gait and mobility: Secondary | ICD-10-CM

## 2013-10-08 DIAGNOSIS — R262 Difficulty in walking, not elsewhere classified: Secondary | ICD-10-CM

## 2013-10-08 NOTE — Progress Notes (Signed)
Physical Therapy Treatment Patient Details  Name: David Herrera MRN: [id number] Date of Birth: 03-Aug-1933  Today's Date: 10/08/2013 Time: 1315-1400 PT Time Calculation (min): 45 min Visit#: 6 of 8  Re-eval: 10/22/13  gt 4098-1191; neuromuscular re-ed 4782-9562; there ex 1346-1400 Authorization: [employer] medicare  Authorization Visit#: 6 of 8   Subjective: Symptoms/Limitations Symptoms: Pt states that he is more limited by his knee now than his hip; going down steps is difficult but this is due to his knee. Pain Assessment Currently in Pain?: Yes Pain Score: 3  Pain Location: Knee Pain Orientation: Right Pain Type: Surgical pain    Exercise/Treatments   Aerobic Tread Mill: gt trainer R 90; L 92 x 8:00 at .60 cycle/sec.   Standing Terminal Knee Extension: 10 reps;Theraband Theraband Level (Terminal Knee Extension): Level 4 (Blue) Stairs: 2 RT in stairwell not allowing R leg to kick to the side when going up  Rocker Board: 2 minutes;Limitations Rocker Board Limitations: R/L  and A/P with therapist facilitation SLS: L 20", Rt  10" max of 3 Gait Training: Gait training without SPC,outside ramps,steps  and grass cueing for heel to toe pattern Other Standing Knee Exercises: tandem and retro gt x 2 RT Other Standing Knee Exercises: warrior II concentrating on not allowing knee to collapse medially   Supine Bridges: 15 reps;Limitations Bridges Limitations: slow hold for 5 sec. Straight Leg Raises: Both;10 reps Sidelying Hip ABduction: 10 reps;Limitations Hip ABduction Limitations: slow 4# no wt circles forward and back x 10 @ Prone  Hip Extension: 15 reps;Right Hip Extension Limitations: 4# slow hold for 5 seconds     Physical Therapy Assessment and Plan PT Assessment and Plan Clinical Impression Statement: Pt continues to have limitations due to OA in his knee.  Pt ambulated outside without an assistive device as well as adding tandem gt to improve balance PT Plan:  reassess next week.    Goals  progressing  Problem List Patient Active Problem List   Diagnosis Date Noted  . Difficulty in walking(719.7) 09/22/2013  . Unstable balance 09/22/2013  . Weakness of right leg 09/22/2013  . Hypertension   . DJD (degenerative joint disease) of hip     PT - End of Session Equipment Utilized During Treatment: Gait belt Activity Tolerance: Patient tolerated treatment well General Behavior During Therapy: WFL for tasks assessed/performed  GP    RUSSELL,CINDY 10/08/2013, 2:10 PM

## 2013-10-13 ENCOUNTER — Ambulatory Visit (HOSPITAL_COMMUNITY)
Admission: RE | Admit: 2013-10-13 | Discharge: 2013-10-13 | Disposition: A | Discharge status: Home / Self Care | Payer: Medicare Other | Source: Ambulatory Visit | Attending: Internal Medicine | Admitting: Internal Medicine

## 2013-10-13 NOTE — Progress Notes (Signed)
Physical Therapy Treatment Patient Details  Name: David Herrera MRN: [id number] Date of Birth: 03-25-1933  Today's Date: 10/13/2013 Time: 4098-1191 PT Time Calculation (min): 43 min Charges: Gait x 4'(7829-5621) Therex x 30'(8657-8469)  Visit#: 7 of 8  Re-eval: 10/22/13  Authorization: [employer] medicare  Authorization Visit#: 7 of 8   Subjective: Symptoms/Limitations Symptoms: Pt states that his hips is feeling good. Pt states he is still having difficulty with SLS. Pain Assessment Currently in Pain?: No/denies  Exercise/Treatments Aerobic Stationary Bike: Nustep L4 hills #3 x 10 min SPM>100 Standing Rocker Board: 2 minutes;Limitations Rocker Board Limitations: R/L  and A/P with therapist facilitation SLS: L 13", Rt  6" max of 3 Gait Training: Gait training without SPC,outside ramps,steps  and grass cueing for heel to toe pattern Other Standing Knee Exercises: Side stepping with blue tubing 1 RT Supine Bridges: 15 reps;Limitations Bridges Limitations: slow hold for 5 sec. Straight Leg Raises: 10 reps;Both  Physical Therapy Assessment and Plan PT Assessment and Plan Clinical Impression Statement: Pt is progressing well. Pt is able to negotiate stairs, ramps and uneven ground without SPC without LOB. Pt has most difficulty with side lying abduction. He requires multimodal cueing to improve hip stability and avoid ER. Muscle fatigue noted with this activity without weight therefore no weight was added. PT Plan: Reassess next session.    Problem List Patient Active Problem List   Diagnosis Date Noted  . Difficulty in walking(719.7) 09/22/2013  . Unstable balance 09/22/2013  . Weakness of right leg 09/22/2013  . Hypertension   . DJD (degenerative joint disease) of hip     PT - End of Session Equipment Utilized During Treatment: Gait belt Activity Tolerance: Patient tolerated treatment well General Behavior During Therapy: [facility] for tasks assessed/performed  Seth Bake, PTA 10/13/2013, 3:51 PM

## 2013-10-15 ENCOUNTER — Ambulatory Visit (HOSPITAL_COMMUNITY)
Admission: RE | Admit: 2013-10-15 | Discharge: 2013-10-15 | Disposition: A | Discharge status: Home / Self Care | Payer: Medicare Other | Source: Ambulatory Visit | Attending: Internal Medicine | Admitting: Internal Medicine

## 2013-10-15 DIAGNOSIS — R29898 Other symptoms and signs involving the musculoskeletal system: Secondary | ICD-10-CM

## 2013-10-15 DIAGNOSIS — R2689 Other abnormalities of gait and mobility: Secondary | ICD-10-CM

## 2013-10-15 DIAGNOSIS — R262 Difficulty in walking, not elsewhere classified: Secondary | ICD-10-CM

## 2013-10-15 NOTE — Evaluation (Signed)
Physical Therapy Evaluation  Patient Details  Name: David Herrera MRN: [id number] Date of Birth: Dec 06, 1933  Today's Date: 10/15/2013 Time: 1310-1345 PT Time Calculation (min): 35 min              Visit#: 8 of    Re-eval:   Assessment Diagnosis: Rt THP Surgical Date: 09/02/13 Next MD Visit: Mckinley Jewel October  Authorization: [facility] medicare    Authorization Visit#: 8 of 8   Past Medical History:  Past Medical History  Diagnosis Date  . Hypertension   . DJD (degenerative joint disease) of hip     right   Past Surgical History:  Past Surgical History  Procedure Laterality Date  . No past surgeries    . Total hip arthroplasty Right 09/02/2013    Procedure: TOTAL HIP ARTHROPLASTY;  Surgeon: Loreta Ave, MD;  Location: [facility] OR;  Service: Orthopedics;  Laterality: Right;    Subjective Symptoms/Limitations Symptoms: Pt hip is feeling better. How long can you sit comfortably?: Pt is able to sit for an hour and a half without difficulty How long can you stand comfortably?: Pt is able to stand for 30 minutes without any problem was five minutes. How long can you walk comfortably?: Pt is no longer walking with a cane was walking with a cane.  Ambulating as much as he wants.  Patient Stated Goals: Pt is having difficutly going up and down steps due to his knee not his hip; planning on dancing tonight  Pain Assessment Currently in Pain?: No/denies Pain Score: 3  Pain Location: Knee Pain Type: Surgical pain  Precautions/Restrictions  Precautions Precautions: Posterior Hip;Other (comment) (Lateral)   Sensation/Coordination/Flexibility/Functional Tests Functional Tests Functional Tests: ABC 78%  Assessment RLE Strength Right Hip Flexion: 5/5 (was 5/5) Right Hip Extension: 5/5 (was 5/5) Right Hip ABduction: 4/5 ( was 3-/5) Right Knee Flexion: 5/5 (was 4/5) Right Knee Extension: 5/5 (was 5/5) Right Ankle Dorsiflexion: 3+/5  Exercise/Treatments Mobility/Balance   Static Standing Balance Single Leg Stance - Right Leg: 10 Single Leg Stance - Left Leg: 30  Standing SLS: Lt 30 Rt 10 Supine Other Supine Knee Exercises: ankle dorsiflexion w/T-band x 10 Sidelying Hip ABduction: 10 reps;Limitations Hip ABduction Limitations: w/ theraband Physical Therapy Assessment and Plan PT Assessment and Plan Clinical Impression Statement: Pt has improved in all aspects main limitation at this point is his knee.   PT Plan: Pt is ready for D/C will work on remaining deficits at home.    Goals Home Exercise Program Pt/caregiver will Perform Home Exercise Program: For increased strengthening PT Goal: Perform Home Exercise Program - Progress: Met PT Short Term Goals Time to Complete Short Term Goals: 2 weeks PT Short Term Goal 1: Pt to be walking inside without  a cane PT Short Term Goal 1 - Progress: Met PT Short Term Goal 2: Pt to be walking for at least 20 minutes without stopping PT Short Term Goal 2 - Progress: Met PT Short Term Goal 3: Pt pain to be no greater than a 2  PT Short Term Goal 3 - Progress: Met PT Short Term Goal 4: Pt to be able to SLS x 10 seconds for improved safety PT Short Term Goal 4 - Progress: Met PT Long Term Goals Time to Complete Long Term Goals: 4 weeks PT Long Term Goal 1: I in advance HEP PT Long Term Goal 1 - Progress: Met PT Long Term Goal 2: Ambulating outside without a cane PT Long Term Goal 2 - Progress: Met  Long Term Goal 3: strength improved one grade to allow reciprocal stair climbing Long Term Goal 3 Progress: Progressing toward goal Long Term Goal 4: Pt to be able to walk for at least 60 mintues without stopping Long Term Goal 4 Progress: Met PT Long Term Goal 5: Pt to have gone dancing Long Term Goal 5 Progress:  (unknown )  Problem List Patient Active Problem List   Diagnosis Date Noted  . Difficulty in walking(719.7) 09/22/2013  . Unstable balance 09/22/2013  . Weakness of right leg 09/22/2013  .  Hypertension   . DJD (degenerative joint disease) of hip     PT Plan of Care PT Home Exercise Plan: new exercise sheet given focusing on current deficits.  GP Functional Assessment Tool Used: clincal judgement/ ABC Functional Limitation: Mobility: Walking and moving around Mobility: Walking and Moving Around Goal Status [phone]): At least 1 percent but less than 20 percent impaired, limited or restricted Mobility: Walking and Moving Around Discharge Status [phone]): At least 20 percent but less than 40 percent impaired, limited or restricted  RUSSELL,CINDY 10/15/2013, 2:51 PM  Physician Documentation Your signature is required to indicate approval of the treatment plan as stated above.  Please sign and either send electronically or make a copy of this report for your files and return this physician signed original.   Please mark one 1.__approve of plan  2. ___approve of plan with the following conditions.   ______________________________                                                          _____________________ Physician Signature                                                                                                             Date

## 2014-05-20 ENCOUNTER — Encounter (HOSPITAL_COMMUNITY): Payer: Self-pay | Admitting: Pharmacy Technician

## 2014-05-28 ENCOUNTER — Encounter (HOSPITAL_COMMUNITY)
Admission: RE | Admit: 2014-05-28 | Discharge: 2014-05-28 | Disposition: A | Discharge status: Home / Self Care | Payer: Medicare Other | Source: Ambulatory Visit | Attending: Ophthalmology | Admitting: Ophthalmology

## 2014-05-28 ENCOUNTER — Encounter (HOSPITAL_COMMUNITY): Payer: Self-pay

## 2014-05-28 ENCOUNTER — Other Ambulatory Visit: Payer: Self-pay

## 2014-05-28 DIAGNOSIS — Z01812 Encounter for preprocedural laboratory examination: Secondary | ICD-10-CM | POA: Insufficient documentation

## 2014-05-28 DIAGNOSIS — Z01810 Encounter for preprocedural cardiovascular examination: Secondary | ICD-10-CM | POA: Insufficient documentation

## 2014-05-28 HISTORY — DX: Unspecified hearing loss, unspecified ear: H91.90

## 2014-05-28 LAB — SURGICAL PCR SCREEN
MRSA, PCR: NEGATIVE
Staphylococcus aureus: NEGATIVE

## 2014-05-28 LAB — BASIC METABOLIC PANEL
BUN: 12 mg/dL (ref 6–23)
CALCIUM: 9.4 mg/dL (ref 8.4–10.5)
CO2: 29 mEq/L (ref 19–32)
Chloride: 99 mEq/L (ref 96–112)
Creatinine, Ser: 0.73 mg/dL (ref 0.50–1.35)
GFR calc Af Amer: >90 mL/min (ref >90)
GFR, EST NON AFRICAN AMERICAN: 87 mL/min — AB (ref >90)
GLUCOSE: 193 mg/dL — AB (ref 70–99)
Potassium: 4.6 mEq/L (ref 3.7–5.3)
Sodium: 140 mEq/L (ref 137–147)

## 2014-05-28 LAB — HEMOGLOBIN AND HEMATOCRIT, BLOOD
HCT: 43.1 % (ref 39.0–52.0)
Hemoglobin: 14.2 g/dL (ref 13.0–17.0)

## 2014-05-28 NOTE — Patient Instructions (Signed)
Your procedure is scheduled on: 06/03/2014  Report to [facility] at   800  AM.  Call this number if you have problems the morning of surgery: [phone]   Do not eat food or drink liquids :After Midnight.      Take these medicines the morning of surgery with A SIP OF WATER: hydrochlorathazide   Do not wear jewelry, make-up or nail polish.  Do not wear lotions, powders, or perfumes.  Do not shave 48 hours prior to surgery.  Do not bring valuables to the hospital.  Contacts, dentures or bridgework may not be worn into surgery.  Leave suitcase in the car. After surgery it may be brought to your room.  For patients admitted to the hospital, checkout time is 11:00 AM the day of discharge.   Patients discharged the day of surgery will not be allowed to drive home.  :     Please read over the following fact sheets that you were given: Coughing and Deep Breathing, Surgical Site Infection Prevention, Anesthesia Post-op Instructions and Care and Recovery After Surgery    Cataract A cataract is a clouding of the lens of the eye. When a lens becomes cloudy, vision is reduced based on the degree and nature of the clouding. Many cataracts reduce vision to some degree. Some cataracts make people more near-sighted as they develop. Other cataracts increase glare. Cataracts that are ignored and become worse can sometimes look white. The white color can be seen through the pupil. CAUSES   Aging. However, cataracts may occur at any age, even in newborns.   Certain drugs.   Trauma to the eye.   Certain diseases such as diabetes.   Specific eye diseases such as chronic inflammation inside the eye or a sudden attack of a rare form of glaucoma.   Inherited or acquired medical problems.  SYMPTOMS   Gradual, progressive drop in vision in the affected eye.   Severe, rapid visual loss. This most often happens when trauma is the cause.  DIAGNOSIS  To detect a cataract, an eye doctor examines the lens.  Cataracts are best diagnosed with an exam of the eyes with the pupils enlarged (dilated) by drops.  TREATMENT  For an early cataract, vision may improve by using different eyeglasses or stronger lighting. If that does not help your vision, surgery is the only effective treatment. A cataract needs to be surgically removed when vision loss interferes with your everyday activities, such as driving, reading, or watching TV. A cataract may also have to be removed if it prevents examination or treatment of another eye problem. Surgery removes the cloudy lens and usually replaces it with a substitute lens (intraocular lens, IOL).  At a time when both you and your doctor agree, the cataract will be surgically removed. If you have cataracts in both eyes, only one is usually removed at a time. This allows the operated eye to heal and be out of danger from any possible problems after surgery (such as infection or poor wound healing). In rare cases, a cataract may be doing damage to your eye. In these cases, your caregiver may advise surgical removal right away. The vast majority of people who have cataract surgery have better vision afterward. HOME CARE INSTRUCTIONS  If you are not planning surgery, you may be asked to do the following:  Use different eyeglasses.   Use stronger or brighter lighting.   Ask your eye doctor about reducing your medicine dose or changing medicines if  it is thought that a medicine caused your cataract. Changing medicines does not make the cataract go away on its own.   Become familiar with your surroundings. Poor vision can lead to injury. Avoid bumping into things on the affected side. You are at a higher risk for tripping or falling.   Exercise extreme care when driving or operating machinery.   Wear sunglasses if you are sensitive to bright light or experiencing problems with glare.  SEEK IMMEDIATE MEDICAL CARE IF:   You have a worsening or sudden vision loss.   You notice  redness, swelling, or increasing pain in the eye.   You have a fever.  Document Released: 12/17/2005 Document Revised: 12/06/2011 Document Reviewed: 08/10/2011 [facility] Patient Information 2012 [place].PATIENT INSTRUCTIONS POST-ANESTHESIA  IMMEDIATELY FOLLOWING SURGERY:  Do not drive or operate machinery for the first twenty four hours after surgery.  Do not make any important decisions for twenty four hours after surgery or while taking narcotic pain medications or sedatives.  If you develop intractable nausea and vomiting or a severe headache please notify your doctor immediately.  FOLLOW-UP:  Please make an appointment with your surgeon as instructed. You do not need to follow up with anesthesia unless specifically instructed to do so.  WOUND CARE INSTRUCTIONS (if applicable):  Keep a dry clean dressing on the anesthesia/puncture wound site if there is drainage.  Once the wound has quit draining you may leave it open to air.  Generally you should leave the bandage intact for twenty four hours unless there is drainage.  If the epidural site drains for more than 36-48 hours please call the anesthesia department.  QUESTIONS?:  Please feel free to call your physician or the hospital operator if you have any questions, and they will be happy to assist you.

## 2014-05-28 NOTE — Pre-Procedure Instructions (Signed)
Patient given information to sign up for my chart at home. 

## 2014-06-02 MED ORDER — PHENYLEPHRINE HCL 2.5 % OP SOLN
OPHTHALMIC | Status: AC
  Filled 2014-06-02: qty 15

## 2014-06-02 MED ORDER — CYCLOPENTOLATE-PHENYLEPHRINE OP SOLN OPTIME - NO CHARGE
OPHTHALMIC | Status: AC
  Filled 2014-06-02: qty 2

## 2014-06-02 MED ORDER — NEOMYCIN-POLYMYXIN-DEXAMETH 3.5-10000-0.1 OP SUSP
OPHTHALMIC | Status: AC
  Filled 2014-06-02: qty 5

## 2014-06-02 MED ORDER — TETRACAINE HCL 0.5 % OP SOLN
OPHTHALMIC | Status: AC
  Filled 2014-06-02: qty 2

## 2014-06-03 ENCOUNTER — Encounter (HOSPITAL_COMMUNITY)
Admission: RE | Disposition: A | Discharge status: Home / Self Care | Payer: Self-pay | Source: Ambulatory Visit | Attending: Ophthalmology

## 2014-06-03 ENCOUNTER — Ambulatory Visit (HOSPITAL_COMMUNITY)
Admission: RE | Admit: 2014-06-03 | Discharge: 2014-06-03 | Disposition: A | Discharge status: Home / Self Care | Payer: Medicare Other | Source: Ambulatory Visit | Attending: Ophthalmology | Admitting: Ophthalmology

## 2014-06-03 ENCOUNTER — Encounter (HOSPITAL_COMMUNITY): Payer: Self-pay | Admitting: Ophthalmology

## 2014-06-03 ENCOUNTER — Encounter (HOSPITAL_COMMUNITY): Payer: Medicare Other | Admitting: Anesthesiology

## 2014-06-03 ENCOUNTER — Ambulatory Visit (HOSPITAL_COMMUNITY): Payer: Medicare Other | Admitting: Anesthesiology

## 2014-06-03 DIAGNOSIS — E119 Type 2 diabetes mellitus without complications: Secondary | ICD-10-CM | POA: Insufficient documentation

## 2014-06-03 DIAGNOSIS — H2589 Other age-related cataract: Secondary | ICD-10-CM | POA: Insufficient documentation

## 2014-06-03 DIAGNOSIS — I10 Essential (primary) hypertension: Secondary | ICD-10-CM | POA: Insufficient documentation

## 2014-06-03 HISTORY — PX: CATARACT EXTRACTION W/PHACO: SHX586

## 2014-06-03 SURGERY — PHACOEMULSIFICATION, CATARACT, WITH IOL INSERTION
Anesthesia: Monitor Anesthesia Care | Site: Eye | Laterality: Right

## 2014-06-03 MED ORDER — POVIDONE-IODINE 5 % OP SOLN
OPHTHALMIC | Status: DC | PRN
  Administered 2014-06-03: 1 via OPHTHALMIC

## 2014-06-03 MED ORDER — MIDAZOLAM HCL 2 MG/2ML IJ SOLN
1.0000 mg | INTRAMUSCULAR | Status: DC | PRN
Start: 2014-06-03 — End: 2014-06-03
  Administered 2014-06-03: 2 mg via INTRAVENOUS

## 2014-06-03 MED ORDER — FENTANYL CITRATE 0.05 MG/ML IJ SOLN
INTRAMUSCULAR | Status: AC
  Filled 2014-06-03: qty 2

## 2014-06-03 MED ORDER — EPINEPHRINE HCL 1 MG/ML IJ SOLN
INTRAMUSCULAR | Status: AC
  Filled 2014-06-03: qty 1

## 2014-06-03 MED ORDER — PHENYLEPHRINE HCL 2.5 % OP SOLN
1.0000 [drp] | OPHTHALMIC | Status: AC | PRN
  Administered 2014-06-03 (×3): 1 [drp] via OPHTHALMIC

## 2014-06-03 MED ORDER — MIDAZOLAM HCL 2 MG/2ML IJ SOLN
INTRAMUSCULAR | Status: AC
  Filled 2014-06-03: qty 2

## 2014-06-03 MED ORDER — LIDOCAINE 3.5 % OP GEL OPTIME - NO CHARGE
OPHTHALMIC | Status: DC | PRN
  Administered 2014-06-03: 2 [drp] via OPHTHALMIC

## 2014-06-03 MED ORDER — BSS IO SOLN
INTRAOCULAR | Status: DC | PRN
  Administered 2014-06-03: 10:00:00

## 2014-06-03 MED ORDER — LACTATED RINGERS IV SOLN
INTRAVENOUS | Status: DC
  Administered 2014-06-03: 09:00:00 via INTRAVENOUS

## 2014-06-03 MED ORDER — EPINEPHRINE HCL 1 MG/ML IJ SOLN
INTRAMUSCULAR | Status: DC | PRN
  Administered 2014-06-03: 10:00:00 via OPHTHALMIC

## 2014-06-03 MED ORDER — TETRACAINE HCL 0.5 % OP SOLN
1.0000 [drp] | OPHTHALMIC | Status: AC | PRN
  Administered 2014-06-03 (×3): 1 [drp] via OPHTHALMIC

## 2014-06-03 MED ORDER — BSS IO SOLN
INTRAOCULAR | Status: DC | PRN
  Administered 2014-06-03: 15 mL

## 2014-06-03 MED ORDER — LIDOCAINE HCL 3.5 % OP GEL
1.0000 "application " | Freq: Once | OPHTHALMIC | Status: AC
  Administered 2014-06-03: 1 via OPHTHALMIC

## 2014-06-03 MED ORDER — NEOMYCIN-POLYMYXIN-DEXAMETH 3.5-10000-0.1 OP SUSP
OPHTHALMIC | Status: DC | PRN
  Administered 2014-06-03: 2 [drp] via OPHTHALMIC

## 2014-06-03 MED ORDER — PROVISC 10 MG/ML IO SOLN
INTRAOCULAR | Status: DC | PRN
  Administered 2014-06-03: 0.85 mL via INTRAOCULAR

## 2014-06-03 MED ORDER — CYCLOPENTOLATE-PHENYLEPHRINE 0.2-1 % OP SOLN
1.0000 [drp] | OPHTHALMIC | Status: AC | PRN
  Administered 2014-06-03 (×3): 1 [drp] via OPHTHALMIC

## 2014-06-03 MED ORDER — FENTANYL CITRATE 0.05 MG/ML IJ SOLN
25.0000 ug | INTRAMUSCULAR | Status: AC
Start: 2014-06-03 — End: 2014-06-03
  Administered 2014-06-03 (×2): 25 ug via INTRAVENOUS

## 2014-06-03 SURGICAL SUPPLY — 35 items
CAPSULAR TENSION RING-AMO (OPHTHALMIC RELATED) IMPLANT
CLOTH BEACON ORANGE TIMEOUT ST (SAFETY) ×3 IMPLANT
EYE SHIELD UNIVERSAL CLEAR (GAUZE/BANDAGES/DRESSINGS) ×3 IMPLANT
GLOVE BIO SURGEON STRL SZ 6.5 (GLOVE) IMPLANT
GLOVE BIO SURGEONS STRL SZ 6.5 (GLOVE)
GLOVE BIOGEL PI IND STRL 6.5 (GLOVE) ×1 IMPLANT
GLOVE BIOGEL PI IND STRL 7.0 (GLOVE) ×1 IMPLANT
GLOVE BIOGEL PI IND STRL 7.5 (GLOVE) ×1 IMPLANT
GLOVE BIOGEL PI INDICATOR 6.5 (GLOVE) ×2
GLOVE BIOGEL PI INDICATOR 7.0 (GLOVE) ×2
GLOVE BIOGEL PI INDICATOR 7.5 (GLOVE) ×2
GLOVE ECLIPSE 6.5 STRL STRAW (GLOVE) IMPLANT
GLOVE ECLIPSE 7.0 STRL STRAW (GLOVE) IMPLANT
GLOVE ECLIPSE 7.5 STRL STRAW (GLOVE) IMPLANT
GLOVE EXAM NITRILE LRG STRL (GLOVE) IMPLANT
GLOVE EXAM NITRILE MD LF STRL (GLOVE) IMPLANT
GLOVE SKINSENSE NS SZ6.5 (GLOVE)
GLOVE SKINSENSE NS SZ7.0 (GLOVE)
GLOVE SKINSENSE STRL SZ6.5 (GLOVE) IMPLANT
GLOVE SKINSENSE STRL SZ7.0 (GLOVE) IMPLANT
GOWN STRL REUS W/ TWL XL LVL3 (GOWN DISPOSABLE) ×1 IMPLANT
GOWN STRL REUS W/TWL XL LVL3 (GOWN DISPOSABLE) ×2
KIT VITRECTOMY (OPHTHALMIC RELATED) IMPLANT
LENS INTRAOCULAR RESTOR ×3 IMPLANT
PAD ARMBOARD 7.5X6 YLW CONV (MISCELLANEOUS) ×3 IMPLANT
PROC W NO LENS (INTRAOCULAR LENS)
PROC W SPEC LENS (INTRAOCULAR LENS) ×3
PROCESS W NO LENS (INTRAOCULAR LENS) IMPLANT
PROCESS W SPEC LENS (INTRAOCULAR LENS) ×1 IMPLANT
RING MALYGIN (MISCELLANEOUS) IMPLANT
SYR TB 1ML LL NO SAFETY (SYRINGE) ×3 IMPLANT
TAPE SURG TRANSPORE 1 IN (GAUZE/BANDAGES/DRESSINGS) ×1 IMPLANT
TAPE SURGICAL TRANSPORE 1 IN (GAUZE/BANDAGES/DRESSINGS) ×2
VISCOELASTIC ADDITIONAL (OPHTHALMIC RELATED) IMPLANT
WATER STERILE IRR 250ML POUR (IV SOLUTION) ×3 IMPLANT

## 2014-06-03 NOTE — H&P (Signed)
I have reviewed the H&P, the patient was re-examined, and I have identified no interval changes in medical condition and plan of care since the history and physical of record  

## 2014-06-03 NOTE — Transfer of Care (Signed)
Immediate Anesthesia Transfer of Care Note  Patient: David Herrera  Procedure(s) Performed: Procedure(s) with comments: CATARACT EXTRACTION PHACO AND INTRAOCULAR LENS PLACEMENT (IOC) (Right) - CDE:16.07  Patient Location: PACU  Anesthesia Type:MAC  Level of Consciousness: awake, alert , oriented and patient cooperative  Airway & Oxygen Therapy: Patient Spontanous Breathing  Post-op Assessment: Report given to PACU RN and Post -op Vital signs reviewed and stable  Post vital signs: Reviewed and stable  Complications: No apparent anesthesia complications

## 2014-06-03 NOTE — Op Note (Signed)
Date of Admission: 06/03/2014  Date of Surgery: 06/03/2014   Pre-Op Dx: Cataract Right Eye  Post-Op Dx: Combined Cataract Right  Eye,  Dx Code 366.19  Surgeon: Tonny Branch, M.D.  Assistants: None  Anesthesia: Topical with MAC  Indications: Painless, progressive loss of vision with compromise of daily activities.  Surgery: Cataract Extraction with Intraocular lens Implant Right Eye  Discription: The patient had dilating drops and viscous lidocaine placed into the Right eye in the pre-op holding area. After transfer to the operating room, a time out was performed. The patient was then prepped and draped. Beginning with a 35 degree blade a paracentesis port was made at the surgeon's 2 o'clock position. The anterior chamber was then filled with 1% non-preserved lidocaine. This was followed by filling the anterior chamber with Provisc.  A 2.68mm keratome blade was used to make a clear corneal incision at the temporal limbus.  A bent cystatome needle was used to create a continuous tear capsulotomy. Hydrodissection was performed with balanced salt solution on a Fine canula. The lens nucleus was then removed using the phacoemulsification handpiece. Residual cortex was removed with the I&A handpiece. The anterior chamber and capsular bag were refilled with Provisc. A posterior chamber intraocular lens was placed into the capsular bag with it's injector. The implant was positioned with the Kuglan hook. The Provisc was then removed from the anterior chamber and capsular bag with the I&A handpiece. Stromal hydration of the main incision and paracentesis port was performed with BSS on a Fine canula. The wounds were tested for leak which was negative. The patient tolerated the procedure well. There were no operative complications. The patient was then transferred to the recovery room in stable condition.  Complications: None  Specimen: None  EBL: None  Prosthetic device: Alcon AcrySof ReStor, [id number], power  20.5 D, SN [id number].

## 2014-06-03 NOTE — Discharge Instructions (Signed)

## 2014-06-03 NOTE — Anesthesia Preprocedure Evaluation (Signed)
Anesthesia Evaluation  Patient identified by MRN, date of birth, ID band Patient awake    Reviewed: Allergy & Precautions, H&P , NPO status , Patient's Chart, lab work & pertinent test results  Airway Mallampati: II TM Distance: >3 FB Neck ROM: full    Dental   Pulmonary neg pulmonary ROS,  breath sounds clear to auscultation        Cardiovascular hypertension, Pt. on medications Rhythm:regular Rate:Normal     Neuro/Psych negative neurological ROS  negative psych ROS   GI/Hepatic negative GI ROS, Neg liver ROS,   Endo/Other  negative endocrine ROSdiabetes (borderline)  Renal/GU negative Renal ROS  negative genitourinary   Musculoskeletal   Abdominal   Peds  Hematology negative hematology ROS (+)   Anesthesia Other Findings See surgeon's H&P   Reproductive/Obstetrics negative OB ROS                           Anesthesia Physical Anesthesia Plan  ASA: II  Anesthesia Plan: MAC   Post-op Pain Management:    Induction: Intravenous  Airway Management Planned: Nasal Cannula  Additional Equipment:   Intra-op Plan:   Post-operative Plan:   Informed Consent: I have reviewed the patients History and Physical, chart, labs and discussed the procedure including the risks, benefits and alternatives for the proposed anesthesia with the patient or authorized representative who has indicated his/her understanding and acceptance.     Plan Discussed with:   Anesthesia Plan Comments:         Anesthesia Quick Evaluation

## 2014-06-03 NOTE — Anesthesia Postprocedure Evaluation (Signed)
  Anesthesia Post-op Note  Patient: David Herrera  Procedure(s) Performed: Procedure(s) with comments: CATARACT EXTRACTION PHACO AND INTRAOCULAR LENS PLACEMENT (IOC) (Right) - CDE:16.07  Patient Location: Short Stay  Anesthesia Type:MAC  Level of Consciousness: awake, alert , oriented and patient cooperative  Airway and Oxygen Therapy: Patient Spontanous Breathing  Post-op Pain: none  Post-op Assessment: Post-op Vital signs reviewed, Patient's Cardiovascular Status Stable, Respiratory Function Stable and Pain level controlled  Post-op Vital Signs: Reviewed and stable  Last Vitals:  Filed Vitals:   06/03/14 0925  BP: 158/98  Pulse:   Temp:   Resp: 21    Complications: No apparent anesthesia complications

## 2014-06-04 ENCOUNTER — Encounter (HOSPITAL_COMMUNITY): Payer: Self-pay | Admitting: Ophthalmology

## 2014-06-14 ENCOUNTER — Encounter (HOSPITAL_COMMUNITY): Payer: Self-pay

## 2014-06-15 NOTE — Patient Instructions (Addendum)
Your procedure is scheduled on:  06/21/14  Report to [facility] at 07:30 AM.  Call this number if you have problems the morning of surgery: [phone]   Remember:   Do not eat food or drink liquids after midnight.   Take these medicines the morning of surgery with A SIP OF WATER: Hydrochlorothiazide   Do not wear jewelry, make-up or nail polish.  Do not wear lotions, powders, or perfumes. You may wear deodorant.  Do not bring valuables to the hospital.  [facility] is not responsible for any belongings or valuables.               Contacts, dentures or bridgework may not be worn into surgery.               Patients discharged the day of surgery will not be allowed to drive home.   Special Instructions: Start using your eye drops prior to surgery as directed by your eye doctor.   Please read over the following fact sheets that you were given: Anesthesia Post-op Instructions and Care and Recovery After Surgery     Cataract Surgery  A cataract is a clouding of the lens of the eye. When a lens becomes cloudy, vision is reduced based on the degree and nature of the clouding. Surgery may be needed to improve vision. Surgery removes the cloudy lens and usually replaces it with a substitute lens (intraocular lens, IOL). LET YOUR EYE DOCTOR KNOW ABOUT:  Allergies to food or medicine.  Medicines taken including herbs, eyedrops, over-the-counter medicines, and creams.  Use of steroids (by mouth or creams).  Previous problems with anesthetics or numbing medicine.  History of bleeding problems or blood clots.  Previous surgery.  Other health problems, including diabetes and kidney problems.  Possibility of pregnancy, if this applies. RISKS AND COMPLICATIONS  Infection.  Inflammation of the eyeball (endophthalmitis) that can spread to both eyes (sympathetic ophthalmia).  Poor wound healing.  If an IOL is inserted, it can later fall out of proper position. This is very  uncommon.  Clouding of the part of your eye that holds an IOL in place. This is called an "after-cataract." These are uncommon, but easily treated. BEFORE THE PROCEDURE  Do not eat or drink anything except small amounts of water for 8 to 12 before your surgery, or as directed by your caregiver.  Unless you are told otherwise, continue any eyedrops you have been prescribed.  Talk to your primary caregiver about all other medicines that you take (both prescription and non-prescription). In some cases, you may need to stop or change medicines near the time of your surgery. This is most important if you are taking blood-thinning medicine.Do not stop medicines unless you are told to do so.  Arrange for someone to drive you to and from the procedure.  Do not put contact lenses in either eye on the day of your surgery. PROCEDURE There is more than one method for safely removing a cataract. Your doctor can explain the differences and help determine which is best for you. Phacoemulsification surgery is the most common form of cataract surgery.  An injection is given behind the eye or eyedrops are given to make this a painless procedure.  A small cut (incision) is made on the edge of the clear, dome-shaped surface that covers the front of the eye (cornea).  A tiny probe is painlessly inserted into the eye. This device gives off ultrasound waves that soften and break up the  cloudy center of the lens. This makes it easier for the cloudy lens to be removed by suction.  An IOL may be implanted.  The normal lens of the eye is covered by a clear capsule. Part of that capsule is intentionally left in the eye to support the IOL.  Your surgeon may or may not use stitches to close the incision. There are other forms of cataract surgery that require a larger incision and stiches to close the eye. This approach is taken in cases where the doctor feels that the cataract cannot be easily removed using  phacoemulsification. AFTER THE PROCEDURE  When an IOL is implanted, it does not need care. It becomes a permanent part of your eye and cannot be seen or felt.  Your doctor will schedule follow-up exams to check on your progress.  Review your other medicines with your doctor to see which can be resumed after surgery.  Use eyedrops or take medicine as prescribed by your doctor. Document Released: 12/06/2011 Document Revised: 03/10/2012 Document Reviewed: 12/06/2011 [facility] Patient Information 2013 [place].    PATIENT INSTRUCTIONS POST-ANESTHESIA  IMMEDIATELY FOLLOWING SURGERY:  Do not drive or operate machinery for the first twenty four hours after surgery.  Do not make any important decisions for twenty four hours after surgery or while taking narcotic pain medications or sedatives.  If you develop intractable nausea and vomiting or a severe headache please notify your doctor immediately.  FOLLOW-UP:  Please make an appointment with your surgeon as instructed. You do not need to follow up with anesthesia unless specifically instructed to do so.  WOUND CARE INSTRUCTIONS (if applicable):  Keep a dry clean dressing on the anesthesia/puncture wound site if there is drainage.  Once the wound has quit draining you may leave it open to air.  Generally you should leave the bandage intact for twenty four hours unless there is drainage.  If the epidural site drains for more than 36-48 hours please call the anesthesia department.  QUESTIONS?:  Please feel free to call your physician or the hospital operator if you have any questions, and they will be happy to assist you.

## 2014-06-16 ENCOUNTER — Encounter (HOSPITAL_COMMUNITY): Payer: Self-pay

## 2014-06-17 ENCOUNTER — Encounter (HOSPITAL_COMMUNITY)
Admission: RE | Admit: 2014-06-17 | Discharge: 2014-06-17 | Disposition: A | Discharge status: Home / Self Care | Payer: Medicare Other | Source: Ambulatory Visit | Attending: Ophthalmology | Admitting: Ophthalmology

## 2014-06-17 HISTORY — DX: Rosacea, unspecified: L71.9

## 2014-06-21 ENCOUNTER — Ambulatory Visit (HOSPITAL_COMMUNITY): Payer: Medicare Other | Admitting: Anesthesiology

## 2014-06-21 ENCOUNTER — Encounter (HOSPITAL_COMMUNITY)
Admission: RE | Disposition: A | Discharge status: Home / Self Care | Payer: Self-pay | Source: Ambulatory Visit | Attending: Ophthalmology

## 2014-06-21 ENCOUNTER — Encounter (HOSPITAL_COMMUNITY): Payer: Self-pay | Admitting: *Deleted

## 2014-06-21 ENCOUNTER — Encounter (HOSPITAL_COMMUNITY): Payer: Medicare Other | Admitting: Anesthesiology

## 2014-06-21 ENCOUNTER — Ambulatory Visit (HOSPITAL_COMMUNITY)
Admission: RE | Admit: 2014-06-21 | Discharge: 2014-06-21 | Disposition: A | Discharge status: Home / Self Care | Payer: Medicare Other | Source: Ambulatory Visit | Attending: Ophthalmology | Admitting: Ophthalmology

## 2014-06-21 DIAGNOSIS — M129 Arthropathy, unspecified: Secondary | ICD-10-CM | POA: Insufficient documentation

## 2014-06-21 DIAGNOSIS — E119 Type 2 diabetes mellitus without complications: Secondary | ICD-10-CM | POA: Insufficient documentation

## 2014-06-21 DIAGNOSIS — L408 Other psoriasis: Secondary | ICD-10-CM | POA: Insufficient documentation

## 2014-06-21 DIAGNOSIS — E7800 Pure hypercholesterolemia, unspecified: Secondary | ICD-10-CM | POA: Insufficient documentation

## 2014-06-21 DIAGNOSIS — H2510 Age-related nuclear cataract, unspecified eye: Secondary | ICD-10-CM | POA: Insufficient documentation

## 2014-06-21 DIAGNOSIS — Z7982 Long term (current) use of aspirin: Secondary | ICD-10-CM | POA: Insufficient documentation

## 2014-06-21 DIAGNOSIS — I10 Essential (primary) hypertension: Secondary | ICD-10-CM | POA: Insufficient documentation

## 2014-06-21 HISTORY — PX: CATARACT EXTRACTION W/PHACO: SHX586

## 2014-06-21 LAB — GLUCOSE, CAPILLARY: GLUCOSE-CAPILLARY: 122 mg/dL — AB (ref 70–99)

## 2014-06-21 SURGERY — PHACOEMULSIFICATION, CATARACT, WITH IOL INSERTION
Anesthesia: Monitor Anesthesia Care | Site: Eye | Laterality: Left

## 2014-06-21 MED ORDER — NEOMYCIN-POLYMYXIN-DEXAMETH 3.5-10000-0.1 OP SUSP
OPHTHALMIC | Status: AC
  Filled 2014-06-21: qty 5

## 2014-06-21 MED ORDER — FENTANYL CITRATE 0.05 MG/ML IJ SOLN
25.0000 ug | INTRAMUSCULAR | Status: AC
  Administered 2014-06-21 (×2): 25 ug via INTRAVENOUS

## 2014-06-21 MED ORDER — TETRACAINE HCL 0.5 % OP SOLN
OPHTHALMIC | Status: AC
  Filled 2014-06-21: qty 2

## 2014-06-21 MED ORDER — PROVISC 10 MG/ML IO SOLN
INTRAOCULAR | Status: DC | PRN
  Administered 2014-06-21: 0.85 mL via INTRAOCULAR

## 2014-06-21 MED ORDER — FENTANYL CITRATE 0.05 MG/ML IJ SOLN
INTRAMUSCULAR | Status: AC
  Filled 2014-06-21: qty 2

## 2014-06-21 MED ORDER — NEOMYCIN-POLYMYXIN-DEXAMETH 3.5-10000-0.1 OP SUSP
OPHTHALMIC | Status: DC | PRN
  Administered 2014-06-21: 1 [drp] via OPHTHALMIC

## 2014-06-21 MED ORDER — EPINEPHRINE HCL 1 MG/ML IJ SOLN
INTRAMUSCULAR | Status: DC | PRN
  Administered 2014-06-21: 09:00:00 via OPHTHALMIC

## 2014-06-21 MED ORDER — LIDOCAINE 3.5 % OP GEL OPTIME - NO CHARGE
OPHTHALMIC | Status: DC | PRN
  Administered 2014-06-21: 1 [drp] via OPHTHALMIC

## 2014-06-21 MED ORDER — EPINEPHRINE HCL 1 MG/ML IJ SOLN
INTRAOCULAR | Status: DC | PRN
  Administered 2014-06-21: 09:00:00

## 2014-06-21 MED ORDER — ONDANSETRON HCL 4 MG/2ML IJ SOLN: 4.0000 mg | Freq: Once | INTRAMUSCULAR | Status: DC | PRN

## 2014-06-21 MED ORDER — MIDAZOLAM HCL 2 MG/2ML IJ SOLN
INTRAMUSCULAR | Status: AC
  Filled 2014-06-21: qty 2

## 2014-06-21 MED ORDER — EPINEPHRINE HCL 1 MG/ML IJ SOLN
INTRAMUSCULAR | Status: AC
  Filled 2014-06-21: qty 1

## 2014-06-21 MED ORDER — MIDAZOLAM HCL 2 MG/2ML IJ SOLN
1.0000 mg | INTRAMUSCULAR | Status: DC | PRN
  Administered 2014-06-21: 2 mg via INTRAVENOUS

## 2014-06-21 MED ORDER — PHENYLEPHRINE HCL 2.5 % OP SOLN
1.0000 [drp] | OPHTHALMIC | Status: AC
  Administered 2014-06-21 (×3): 1 [drp] via OPHTHALMIC

## 2014-06-21 MED ORDER — LIDOCAINE HCL (PF) 1 % IJ SOLN: INTRAMUSCULAR | Status: DC | PRN

## 2014-06-21 MED ORDER — CYCLOPENTOLATE-PHENYLEPHRINE OP SOLN OPTIME - NO CHARGE
OPHTHALMIC | Status: AC
  Filled 2014-06-21: qty 2

## 2014-06-21 MED ORDER — LIDOCAINE HCL 3.5 % OP GEL
1.0000 "application " | Freq: Once | OPHTHALMIC | Status: AC
  Administered 2014-06-21: 1 via OPHTHALMIC

## 2014-06-21 MED ORDER — BSS IO SOLN
INTRAOCULAR | Status: DC | PRN
  Administered 2014-06-21: 15 mL via INTRAOCULAR

## 2014-06-21 MED ORDER — LIDOCAINE HCL (PF) 1 % IJ SOLN
INTRAMUSCULAR | Status: AC
  Filled 2014-06-21: qty 2

## 2014-06-21 MED ORDER — PHENYLEPHRINE HCL 2.5 % OP SOLN
OPHTHALMIC | Status: AC
  Filled 2014-06-21: qty 15

## 2014-06-21 MED ORDER — POVIDONE-IODINE 5 % OP SOLN
OPHTHALMIC | Status: DC | PRN
  Administered 2014-06-21: 1 via OPHTHALMIC

## 2014-06-21 MED ORDER — TETRACAINE HCL 0.5 % OP SOLN
1.0000 [drp] | OPHTHALMIC | Status: AC
  Administered 2014-06-21 (×3): 1 [drp] via OPHTHALMIC

## 2014-06-21 MED ORDER — CYCLOPENTOLATE-PHENYLEPHRINE 0.2-1 % OP SOLN
1.0000 [drp] | OPHTHALMIC | Status: AC
  Administered 2014-06-21 (×3): 1 [drp] via OPHTHALMIC

## 2014-06-21 MED ORDER — LIDOCAINE HCL 3.5 % OP GEL
OPHTHALMIC | Status: AC
Start: 2014-06-21 — End: 2014-06-21
  Filled 2014-06-21: qty 1

## 2014-06-21 MED ORDER — FENTANYL CITRATE 0.05 MG/ML IJ SOLN: 25.0000 ug | INTRAMUSCULAR | Status: DC | PRN

## 2014-06-21 MED ORDER — LACTATED RINGERS IV SOLN
INTRAVENOUS | Status: DC
  Administered 2014-06-21: 1000 mL via INTRAVENOUS

## 2014-06-21 SURGICAL SUPPLY — 34 items
CAPSULAR TENSION RING-AMO (OPHTHALMIC RELATED) IMPLANT
CLOTH BEACON ORANGE TIMEOUT ST (SAFETY) ×3 IMPLANT
EYE SHIELD UNIVERSAL CLEAR (GAUZE/BANDAGES/DRESSINGS) ×3 IMPLANT
GLOVE BIO SURGEON STRL SZ 6.5 (GLOVE) IMPLANT
GLOVE BIO SURGEONS STRL SZ 6.5 (GLOVE)
GLOVE BIOGEL PI IND STRL 6.5 (GLOVE) ×1 IMPLANT
GLOVE BIOGEL PI IND STRL 7.0 (GLOVE) IMPLANT
GLOVE BIOGEL PI IND STRL 7.5 (GLOVE) IMPLANT
GLOVE BIOGEL PI INDICATOR 6.5 (GLOVE) ×2
GLOVE BIOGEL PI INDICATOR 7.0 (GLOVE)
GLOVE BIOGEL PI INDICATOR 7.5 (GLOVE)
GLOVE ECLIPSE 6.5 STRL STRAW (GLOVE) IMPLANT
GLOVE ECLIPSE 7.0 STRL STRAW (GLOVE) IMPLANT
GLOVE ECLIPSE 7.5 STRL STRAW (GLOVE) IMPLANT
GLOVE EXAM NITRILE LRG STRL (GLOVE) IMPLANT
GLOVE EXAM NITRILE MD LF STRL (GLOVE) IMPLANT
GLOVE SKINSENSE NS SZ6.5 (GLOVE)
GLOVE SKINSENSE NS SZ7.0 (GLOVE)
GLOVE SKINSENSE STRL SZ6.5 (GLOVE) IMPLANT
GLOVE SKINSENSE STRL SZ7.0 (GLOVE) IMPLANT
GLOVE SS N UNI LF 7.0 STRL (GLOVE) ×3 IMPLANT
KIT VITRECTOMY (OPHTHALMIC RELATED) IMPLANT
LENS INTRAOCULAR RESTOR ×3 IMPLANT
PAD ARMBOARD 7.5X6 YLW CONV (MISCELLANEOUS) ×3 IMPLANT
PROC W NO LENS (INTRAOCULAR LENS)
PROC W SPEC LENS (INTRAOCULAR LENS) ×3
PROCESS W NO LENS (INTRAOCULAR LENS) IMPLANT
PROCESS W SPEC LENS (INTRAOCULAR LENS) ×1 IMPLANT
RING MALYGIN (MISCELLANEOUS) IMPLANT
SYR TB 1ML LL NO SAFETY (SYRINGE) ×3 IMPLANT
TAPE SURG TRANSPORE 1 IN (GAUZE/BANDAGES/DRESSINGS) ×1 IMPLANT
TAPE SURGICAL TRANSPORE 1 IN (GAUZE/BANDAGES/DRESSINGS) ×2
VISCOELASTIC ADDITIONAL (OPHTHALMIC RELATED) IMPLANT
WATER STERILE IRR 250ML POUR (IV SOLUTION) ×3 IMPLANT

## 2014-06-21 NOTE — Transfer of Care (Signed)
Immediate Anesthesia Transfer of Care Note  Patient: David Herrera  Procedure(s) Performed: Procedure(s) (LRB): CATARACT EXTRACTION PHACO AND INTRAOCULAR LENS PLACEMENT (IOC) (Left)  Patient Location: Shortstay  Anesthesia Type: MAC  Level of Consciousness: awake  Airway & Oxygen Therapy: Patient Spontanous Breathing   Post-op Assessment: Report given to PACU RN, Post -op Vital signs reviewed and stable and Patient moving all extremities  Post vital signs: Reviewed and stable  Complications: No apparent anesthesia complications

## 2014-06-21 NOTE — Op Note (Signed)
Date of Admission: 06/21/2014  Date of Surgery: 06/21/2014  Pre-Op Dx: Cataract Left  Eye  Post-Op Dx: Cataract  Left  Eye,  Dx Code 366.19  Surgeon: Tonny Branch, M.D.  Assistants: None  Anesthesia: Topical with MAC  Indications: Painless, progressive loss of vision with compromise of daily activities.  Surgery: Cataract Extraction with Intraocular lens Implant Left Eye  Discription: The patient had dilating drops and viscous lidocaine placed into the Left eye in the pre-op holding area. After transfer to the operating room, a time out was performed. The patient was then prepped and draped. Beginning with a 39 degree blade a paracentesis port was made at the surgeon's 2 o'clock position. The anterior chamber was then filled with 1% non-preserved lidocaine. This was followed by filling the anterior chamber with Provisc.  A 2.75mm keratome blade was used to make a clear corneal incision at the temporal limbus.  A bent cystatome needle was used to create a continuous tear capsulotomy. Hydrodissection was performed with balanced salt solution on a Fine canula. The lens nucleus was then removed using the phacoemulsification handpiece. Residual cortex was removed with the I&A handpiece. The anterior chamber and capsular bag were refilled with Provisc. A posterior chamber intraocular lens was placed into the capsular bag with it's injector. The implant was positioned with the Kuglan hook. The Provisc was then removed from the anterior chamber and capsular bag with the I&A handpiece. Stromal hydration of the main incision and paracentesis port was performed with BSS on a Fine canula. The wounds were tested for leak which was negative. The patient tolerated the procedure well. There were no operative complications. The patient was then transferred to the recovery room in stable condition.  Complications: None  Specimen: None  EBL: None  Prosthetic device: Alcon AcrySof ReStor [id number], power 21.0 D, SN  [id number].

## 2014-06-21 NOTE — H&P (Signed)
I have reviewed the H&P, the patient was re-examined, and I have identified no interval changes in medical condition and plan of care since the history and physical of record  

## 2014-06-21 NOTE — Anesthesia Preprocedure Evaluation (Addendum)
Anesthesia Evaluation  Patient identified by MRN, date of birth, ID band Patient awake    Reviewed: Allergy & Precautions, H&P , NPO status , Patient's Chart, lab work & pertinent test results  Airway Mallampati: II TM Distance: >3 FB Neck ROM: full    Dental   Pulmonary neg pulmonary ROS,  breath sounds clear to auscultation        Cardiovascular hypertension, Pt. on medications Rhythm:regular Rate:Normal     Neuro/Psych negative neurological ROS  negative psych ROS   GI/Hepatic negative GI ROS, Neg liver ROS,   Endo/Other  negative endocrine ROSdiabetes (borderline)  Renal/GU negative Renal ROS  negative genitourinary   Musculoskeletal   Abdominal   Peds  Hematology negative hematology ROS (+)   Anesthesia Other Findings See surgeon's H&P   Reproductive/Obstetrics negative OB ROS                          Anesthesia Physical Anesthesia Plan  ASA: II  Anesthesia Plan: MAC   Post-op Pain Management:    Induction: Intravenous  Airway Management Planned: Nasal Cannula  Additional Equipment:   Intra-op Plan:   Post-operative Plan:   Informed Consent: I have reviewed the patients History and Physical, chart, labs and discussed the procedure including the risks, benefits and alternatives for the proposed anesthesia with the patient or authorized representative who has indicated his/her understanding and acceptance.   Dental Advisory Given  Plan Discussed with: CRNA and Surgeon  Anesthesia Plan Comments:        Anesthesia Quick Evaluation

## 2014-06-21 NOTE — Anesthesia Postprocedure Evaluation (Signed)
  Anesthesia Post-op Note  Patient: David Herrera  Procedure(s) Performed: Procedure(s) (LRB): CATARACT EXTRACTION PHACO AND INTRAOCULAR LENS PLACEMENT (IOC) (Left)  Patient Location:  Short Stay  Anesthesia Type: MAC  Level of Consciousness: awake  Airway and Oxygen Therapy: Patient Spontanous Breathing  Post-op Pain: none  Post-op Assessment: Post-op Vital signs reviewed, Patient's Cardiovascular Status Stable, Respiratory Function Stable, Patent Airway, No signs of Nausea or vomiting and Pain level controlled  Post-op Vital Signs: Reviewed and stable  Complications: No apparent anesthesia complications

## 2014-06-23 ENCOUNTER — Encounter (HOSPITAL_COMMUNITY): Payer: Self-pay | Admitting: Ophthalmology

## 2016-11-14 ENCOUNTER — Emergency Department (HOSPITAL_COMMUNITY): Payer: Medicare Other

## 2016-11-14 ENCOUNTER — Emergency Department (HOSPITAL_COMMUNITY)
Admission: EM | Admit: 2016-11-14 | Discharge: 2016-11-14 | Disposition: A | Discharge status: Home / Self Care | Payer: Medicare Other | Attending: Emergency Medicine | Admitting: Emergency Medicine

## 2016-11-14 ENCOUNTER — Encounter (HOSPITAL_COMMUNITY): Payer: Self-pay

## 2016-11-14 DIAGNOSIS — Z7984 Long term (current) use of oral hypoglycemic drugs: Secondary | ICD-10-CM | POA: Diagnosis not present

## 2016-11-14 DIAGNOSIS — E119 Type 2 diabetes mellitus without complications: Secondary | ICD-10-CM | POA: Insufficient documentation

## 2016-11-14 DIAGNOSIS — Z79899 Other long term (current) drug therapy: Secondary | ICD-10-CM | POA: Diagnosis not present

## 2016-11-14 DIAGNOSIS — R42 Dizziness and giddiness: Secondary | ICD-10-CM | POA: Diagnosis not present

## 2016-11-14 DIAGNOSIS — Z7982 Long term (current) use of aspirin: Secondary | ICD-10-CM | POA: Insufficient documentation

## 2016-11-14 DIAGNOSIS — I10 Essential (primary) hypertension: Secondary | ICD-10-CM | POA: Insufficient documentation

## 2016-11-14 HISTORY — DX: Type 2 diabetes mellitus without complications: E11.9

## 2016-11-14 HISTORY — DX: Dizziness and giddiness: R42

## 2016-11-14 LAB — CBC WITH DIFFERENTIAL/PLATELET
Basophils Absolute: 0 10*3/uL (ref 0.0–0.1)
Basophils Relative: 1 %
EOS ABS: 0.1 10*3/uL (ref 0.0–0.7)
Eosinophils Relative: 1 %
HCT: 45.3 % (ref 39.0–52.0)
HEMOGLOBIN: 15.1 g/dL (ref 13.0–17.0)
Lymphocytes Relative: 19 %
Lymphs Abs: 1.2 10*3/uL (ref 0.7–4.0)
MCH: 29.7 pg (ref 26.0–34.0)
MCHC: 33.3 g/dL (ref 30.0–36.0)
MCV: 89.2 fL (ref 78.0–100.0)
MONO ABS: 0.5 10*3/uL (ref 0.1–1.0)
MONOS PCT: 8 %
NEUTROS PCT: 71 %
Neutro Abs: 4.5 10*3/uL (ref 1.7–7.7)
Platelets: 176 10*3/uL (ref 150–400)
RBC: 5.08 MIL/uL (ref 4.22–5.81)
RDW: 12.7 % (ref 11.5–15.5)
WBC: 6.2 10*3/uL (ref 4.0–10.5)

## 2016-11-14 LAB — BASIC METABOLIC PANEL
Anion gap: 9 (ref 5–15)
BUN: 12 mg/dL (ref 6–20)
CO2: 28 mmol/L (ref 22–32)
Calcium: 9.4 mg/dL (ref 8.9–10.3)
Chloride: 100 mmol/L — ABNORMAL LOW (ref 101–111)
Creatinine, Ser: 0.75 mg/dL (ref 0.61–1.24)
GFR calc Af Amer: >60 mL/min (ref >60)
GLUCOSE: 130 mg/dL — AB (ref 65–99)
Potassium: 3.7 mmol/L (ref 3.5–5.1)
SODIUM: 137 mmol/L (ref 135–145)

## 2016-11-14 LAB — HEPATIC FUNCTION PANEL
ALBUMIN: 4.6 g/dL (ref 3.5–5.0)
ALT: 17 U/L (ref 17–63)
AST: 21 U/L (ref 15–41)
Alkaline Phosphatase: 73 U/L (ref 38–126)
Bilirubin, Direct: 0.1 mg/dL (ref 0.1–0.5)
Indirect Bilirubin: 0.4 mg/dL (ref 0.3–0.9)
Total Bilirubin: 0.5 mg/dL (ref 0.3–1.2)
Total Protein: 7.7 g/dL (ref 6.5–8.1)

## 2016-11-14 LAB — CBG MONITORING, ED
Glucose-Capillary: 111 mg/dL — ABNORMAL HIGH (ref 65–99)
Glucose-Capillary: 99 mg/dL (ref 65–99)

## 2016-11-14 LAB — TROPONIN I: Troponin I: <0.03 ng/mL (ref <0.03)

## 2016-11-14 MED ORDER — MECLIZINE HCL 25 MG PO TABS: 25.0000 mg | ORAL_TABLET | Freq: Three times a day (TID) | ORAL | 0 refills | Status: DC | PRN

## 2016-11-14 MED ORDER — MECLIZINE HCL 12.5 MG PO TABS
25.0000 mg | ORAL_TABLET | Freq: Once | ORAL | Status: AC
  Administered 2016-11-14: 25 mg via ORAL
  Filled 2016-11-14: qty 2

## 2016-11-14 NOTE — ED Provider Notes (Signed)
[facility] DEPT Provider Note   CSN: [id number] Arrival date & time: 11/14/16  1153     History   Chief Complaint Chief Complaint  Patient presents with  . Dizziness  . Hypertension    HPI David Herrera is a 82 y.o. male.  Patient states that this morning he started having some dizziness feeling little bit unsteady when he walks.   The history is provided by the patient. No language interpreter was used.  Dizziness  Quality:  Head spinning Severity:  Mild Onset quality:  Sudden Timing:  Intermittent Progression:  Waxing and waning Chronicity:  New Context: bending over   Relieved by:  Nothing Worsened by:  Nothing Associated symptoms: no chest pain, no diarrhea and no headaches   Hypertension  Pertinent negatives include no chest pain, no abdominal pain and no headaches.    Past Medical History:  Diagnosis Date  . Diabetes mellitus without complication ([place])   . DJD (degenerative joint disease) of hip    right  . HOH (hard of hearing)   . Hypertension   . Rosacea   . Rosacea   . Vertigo     Patient Active Problem List   Diagnosis Date Noted  . Difficulty in walking(719.7) 09/22/2013  . Unstable balance 09/22/2013  . Weakness of right leg 09/22/2013  . Hypertension   . DJD (degenerative joint disease) of hip     Past Surgical History:  Procedure Laterality Date  . CATARACT EXTRACTION W/PHACO Right 06/03/2014   Procedure: CATARACT EXTRACTION PHACO AND INTRAOCULAR LENS PLACEMENT (IOC);  Surgeon: Tonny Branch, MD;  Location: [facility];  Service: Ophthalmology;  Laterality: Right;  CDE:16.07  . CATARACT EXTRACTION W/PHACO Left 06/21/2014   Procedure: CATARACT EXTRACTION PHACO AND INTRAOCULAR LENS PLACEMENT (IOC);  Surgeon: Tonny Branch, MD;  Location: [facility];  Service: Ophthalmology;  Laterality: Left;  CDE:  6.80  . NO PAST SURGERIES    . TOTAL HIP ARTHROPLASTY Right 09/02/2013   Procedure: TOTAL HIP ARTHROPLASTY;  Surgeon: Ninetta Lights, MD;  Location:  [facility];  Service: Orthopedics;  Laterality: Right;       Home Medications    Prior to Admission medications   Medication Sig Start Date End Date Taking? Authorizing Provider  acetaminophen (TYLENOL) 500 MG tablet Take 500 mg by mouth 2 (two) times daily as needed for mild pain.   Yes Historical Provider, MD  aspirin EC 81 MG tablet Take 81 mg by mouth daily.   Yes Historical Provider, MD  Cholecalciferol (VITAMIN D) 2000 UNITS CAPS Take 2,000 Units by mouth 2 (two) times daily.   Yes Historical Provider, MD  co-enzyme Q-10 30 MG capsule Take 30 mg by mouth daily.   Yes Historical Provider, MD  hydrochlorothiazide (HYDRODIURIL) 25 MG tablet Take 25 mg by mouth daily.   Yes Historical Provider, MD  metFORMIN (GLUCOPHAGE) 500 MG tablet 1 tablet daily. 10/29/16  Yes Historical Provider, MD  simvastatin (ZOCOR) 20 MG tablet Take 10 mg by mouth every evening.    Yes Historical Provider, MD  meclizine (ANTIVERT) 25 MG tablet Take 1 tablet (25 mg total) by mouth 3 (three) times daily as needed for dizziness. 11/14/16   Milton Ferguson, MD    Family History Family History  Problem Relation Age of Onset  . Diabetes      Social History Social History  Substance Use Topics  . Smoking status: Never Smoker  . Smokeless tobacco: Never Used  . Alcohol use No     Allergies  Patient has no known allergies.   Review of Systems Review of Systems  Constitutional: Negative for appetite change and fatigue.  HENT: Negative for congestion, ear discharge and sinus pressure.   Eyes: Negative for discharge.  Respiratory: Negative for cough.   Cardiovascular: Negative for chest pain.  Gastrointestinal: Negative for abdominal pain and diarrhea.  Genitourinary: Negative for frequency and hematuria.  Musculoskeletal: Negative for back pain.  Skin: Negative for rash.  Neurological: Positive for dizziness. Negative for seizures and headaches.  Psychiatric/Behavioral: Negative for hallucinations.      Physical Exam Updated Vital Signs BP 143/83   Pulse 74   Temp 97.6 F (36.4 C) (Oral)   Resp 17   Ht 5\' 8"  (1.727 m)   Wt 158 lb (71.7 kg)   SpO2 98%   BMI 24.02 kg/m   Physical Exam  Constitutional: He is oriented to person, place, and time. He appears well-developed.  HENT:  Head: Normocephalic.  Eyes: Conjunctivae and EOM are normal. No scleral icterus.  Neck: Neck supple. No thyromegaly present.  Cardiovascular: Normal rate and regular rhythm.  Exam reveals no gallop and no friction rub.   No murmur heard. Pulmonary/Chest: No stridor. He has no wheezes. He has no rales. He exhibits no tenderness.  Abdominal: He exhibits no distension. There is no tenderness. There is no rebound.  Musculoskeletal: Normal range of motion. He exhibits no edema.  Lymphadenopathy:    He has no cervical adenopathy.  Neurological: He is oriented to person, place, and time. A cranial nerve deficit is present. He exhibits normal muscle tone. Coordination normal.  Finger to nose and heel to chin normal  Skin: No rash noted. No erythema.  Psychiatric: He has a normal mood and affect. His behavior is normal.     ED Treatments / Results  Labs (all labs ordered are listed, but only abnormal results are displayed) Labs Reviewed  BASIC METABOLIC PANEL - Abnormal; Notable for the following:       Result Value   Chloride 100 (*)    Glucose, Bld 130 (*)    All other components within normal limits  CBG MONITORING, ED - Abnormal; Notable for the following:    Glucose-Capillary 111 (*)    All other components within normal limits  CBC WITH DIFFERENTIAL/PLATELET  TROPONIN I  HEPATIC FUNCTION PANEL  CBG MONITORING, ED    EKG  EKG Interpretation  Date/Time:  Wednesday November 14 2016 11:58:59 EST Ventricular Rate:  58 PR Interval:  146 QRS Duration: 94 QT Interval:  450 QTC Calculation: 441 R Axis:   -11 Text Interpretation:  Sinus bradycardia Otherwise normal ECG Confirmed by Dalena Plantz   MD, Kristalyn Bergstresser [phone]) on 11/14/2016 3:39:34 PM       Radiology Ct Head Wo Contrast  Result Date: 11/14/2016 REASON CLINICAL DATA: Patient fell dizzy. Blood pressure was elevated this morning. No weakness, numbness or difficulty speaking. EXAM: CT HEAD WITHOUT CONTRAST TECHNIQUE: Contiguous axial images were obtained from the base of the skull through the vertex without intravenous contrast. COMPARISON:  MRI from 03/16/2011 FINDINGS: Brain: Mild involutional changes of the brain consistent with age. Mild to moderate chronic small vessel and subcortical white matter ischemic disease. No evidence of acute large vascular territory infarction, hemorrhage, hydrocephalus, extra-axial collection or mass lesion/mass effect. Vascular: No hyperdense vessel or unexpected calcification. Bilateral vertebral, basilar, carotid siphon, and circle of Willis vascular calcifications. Skull: Negative for fracture or focal lesion. Sinuses/Orbits: Inspissated mucus in the left frontal sinus with calcifications. A no  acute sinusitis noted. The visualized ethmoid, sphenoid and maxillary sinuses are clear. The mastoids are clear. The orbits are unremarkable. Other: None IMPRESSION: Chronic small vessel ischemic disease of periventricular and subcortical white matter. Mild age related involutional changes of the brain. No acute intracranial abnormality. Inspissated mucus in the left frontal sinus. Electronically Signed   By: Ashley Royalty M.D.   On: 11/14/2016 14:32   Ct Cervical Spine Wo Contrast  Result Date: 11/14/2016 CLINICAL DATA:  Cervical spine abnormality seen on MRI EXAM: CT CERVICAL SPINE WITHOUT CONTRAST TECHNIQUE: Multidetector CT imaging of the cervical spine was performed without intravenous contrast. Multiplanar CT image reconstructions were also generated. COMPARISON:  MRI brain 11/14/2016 FINDINGS: Alignment: There is grade 1 anterolisthesis at C4-C5. Mild retrolisthesis of C1 on C2. No other static subluxation.  Skull base and vertebrae: There is a chronic fracture of the base of the dens with minimal posterior subluxation of the superior aspect. This results in focal angulation of the spinal canal and spinal cord at this level. No other cervical spine fracture is identified. Soft tissues and spinal canal: No prevertebral fluid or swelling. No visible canal hematoma. Disc levels: There is multilevel facet and uncovertebral hypertrophy without advanced spinal canal or neural foraminal stenosis. Foraminal narrowing is worst at left C6-7. Upper chest: Negative Other: None IMPRESSION: 1. No acute fracture or static subluxation of the cervical spine. 2. Chronic, posteriorly displaced fracture of the base the dens (type 2 dens fracture). Electronically Signed   By: Ulyses Jarred M.D.   On: 11/14/2016 20:18   Mr Brain Wo Contrast  Result Date: 11/14/2016 CLINICAL DATA:  Dizziness EXAM: MRI HEAD WITHOUT CONTRAST TECHNIQUE: Multiplanar, multiecho pulse sequences of the brain and surrounding structures were obtained without intravenous contrast. COMPARISON:  03/16/2011 FINDINGS: Brain: No acute infarction, hemorrhage, hydrocephalus, extra-axial collection or mass lesion. Mild for age chronic microvascular disease with FLAIR hyperintense foci scattered throughout the cerebral white matter and thinly confluent around the lateral ventricles. Normal cerebral volume for age. Vascular: Preserved flow voids. Skull and upper cervical spine: On sagittal T1 weighted imaging there appears to be a fracture lucency with corticated margins to the base of dens. There is posterior subluxation of C1 relative to C2. Ligamentum flavum thickening and retro dental ligamentous thickening causes upper canal stenosis without cord compression. There is artifact in this region on sagittal T1 weighted acquisition, but these findings are re- demonstrated on coronal T2 weighted imaging. Sinuses/Orbits: Bilateral cataract resection. Mild mucosal thickening  in the paranasal sinuses. IMPRESSION: 1. No acute finding, including infarct. 2. Question remote nonunited base of dens fracture with posterior subluxation of C1. Recommend follow-up with cervical spine CT. 3. Mild for age chronic microvascular disease. Electronically Signed   By: Monte Fantasia M.D.   On: 11/14/2016 17:04    Procedures Procedures (including critical care time)  Medications Ordered in ED Medications  meclizine (ANTIVERT) tablet 25 mg (25 mg Oral Given 11/14/16 1310)     Initial Impression / Assessment and Plan / ED Course  I have reviewed the triage vital signs and the nursing notes.  Pertinent labs & imaging results that were available during my care of the patient were reviewed by me and considered in my medical decision making (see chart for details).  Clinical Course     CT of head and MRI unremarkable. Patient does have an old fractured his neck. Patient improved with the Antivert. Suspect vertigo he is given more Antivert to take home will follow-up with his  PCP  Final Clinical Impressions(s) / ED Diagnoses   Final diagnoses:  Vertigo    New Prescriptions New Prescriptions   MECLIZINE (ANTIVERT) 25 MG TABLET    Take 1 tablet (25 mg total) by mouth 3 (three) times daily as needed for dizziness.     Milton Ferguson, MD 11/14/16 [phone]

## 2016-11-14 NOTE — ED Notes (Signed)
Patient transported to CT 

## 2016-11-14 NOTE — ED Notes (Signed)
Pt taken to xray 

## 2016-11-14 NOTE — ED Notes (Signed)
Pt back from MRI 

## 2016-11-14 NOTE — ED Triage Notes (Signed)
Pt reports started feeling dizzy and bp was elevated this morning after eating breakfast around 10am.  Denies any weakness, numbness, or difficulty speaking or swallowing.

## 2016-11-14 NOTE — ED Notes (Signed)
EKG given to Dr. Zammit 

## 2016-11-14 NOTE — Discharge Instructions (Signed)
Follow up with your md next week.  Return sooner if problems °

## 2018-02-03 ENCOUNTER — Encounter (HOSPITAL_COMMUNITY): Payer: Self-pay | Admitting: Emergency Medicine

## 2018-02-03 ENCOUNTER — Other Ambulatory Visit: Payer: Self-pay

## 2018-02-03 DIAGNOSIS — Z5321 Procedure and treatment not carried out due to patient leaving prior to being seen by health care provider: Secondary | ICD-10-CM | POA: Insufficient documentation

## 2018-02-03 DIAGNOSIS — R030 Elevated blood-pressure reading, without diagnosis of hypertension: Secondary | ICD-10-CM | POA: Insufficient documentation

## 2018-02-03 NOTE — ED Triage Notes (Signed)
Pt comes in for HTN, reports increased BP readings since this evening. Denies blurred vision, slurred speech, weakness.

## 2018-02-04 ENCOUNTER — Emergency Department (HOSPITAL_COMMUNITY)
Admission: EM | Admit: 2018-02-04 | Discharge: 2018-02-04 | Disposition: A | Discharge status: Home / Self Care | Payer: Medicare Other | Attending: Emergency Medicine | Admitting: Emergency Medicine

## 2018-02-04 NOTE — ED Notes (Signed)
Pt "tired of waiting to be seen" so he left.

## 2018-07-30 IMAGING — CT CT HEAD W/O CM
3 series · 15 of 47 positions shown, 18 images · non-contrast
Comparison: MRI from 03/16/2011

REASON CLINICAL DATA:
Patient fell dizzy. Blood pressure was elevated this morning. No
weakness, numbness or difficulty speaking.

EXAM:
CT HEAD WITHOUT CONTRAST
TECHNIQUE: Contiguous axial images were obtained from the base of the skull
through the vertex without intravenous contrast.

[Series 2: head wo · axial · 0.44mm/px · z∈[+1200,+1335]mm · 9 of 33 slices shown, 12 images]
[im 3/33  brain]
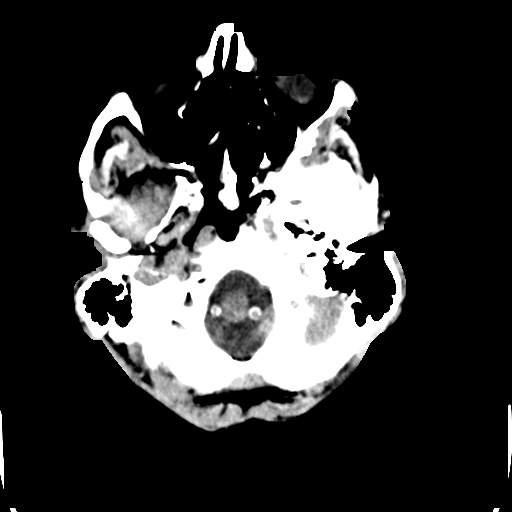
[im 3/33  bone]
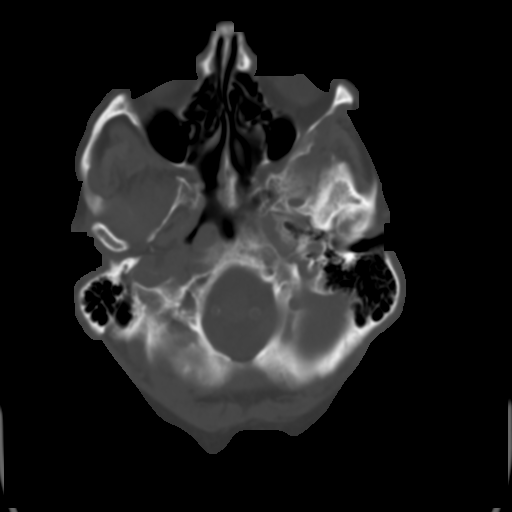
[im 6/33  brain]
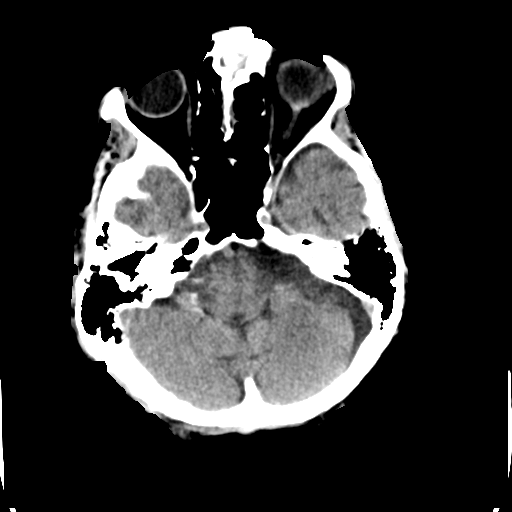
[im 9/33  brain]
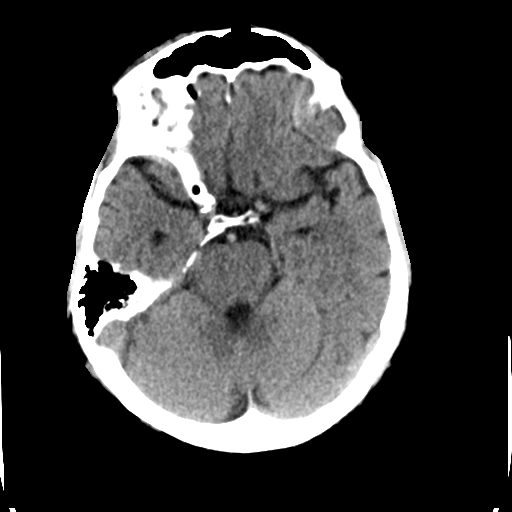
[im 13/33  brain]
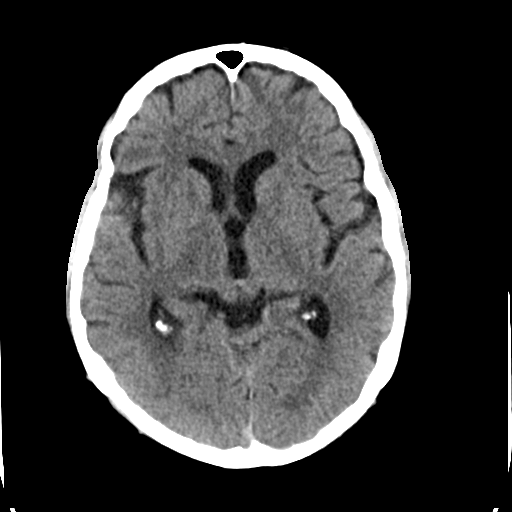
[im 17/33  brain]
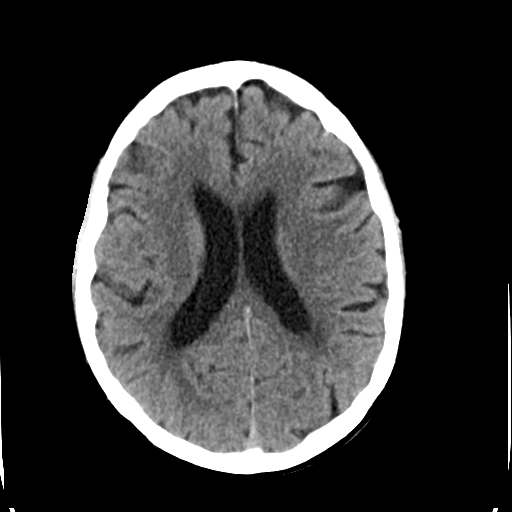
[im 17/33  bone]
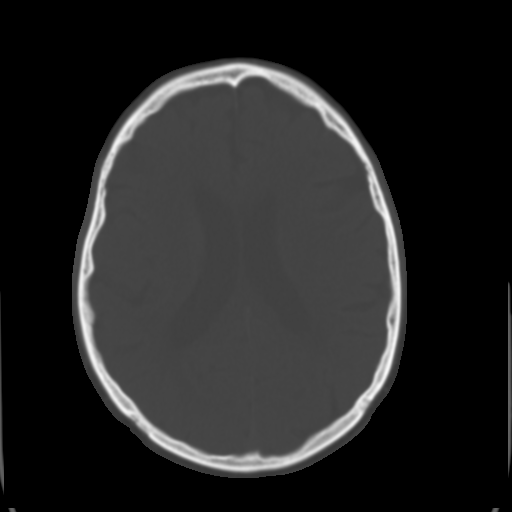
[im 20/33  brain]
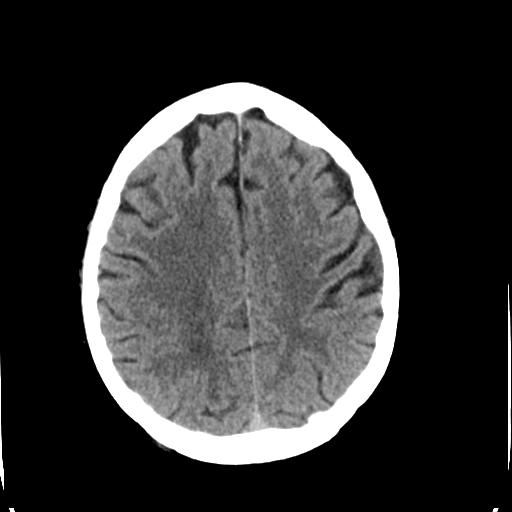
[im 24/33  brain]
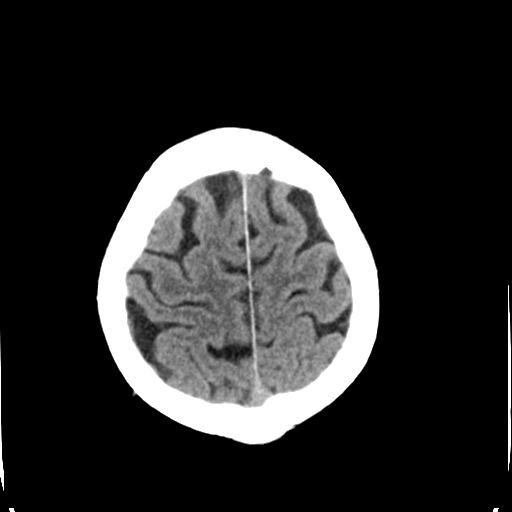
[im 27/33  brain]
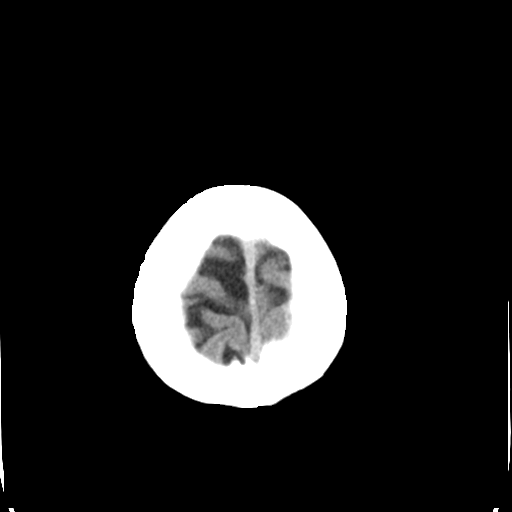
[im 30/33  brain]
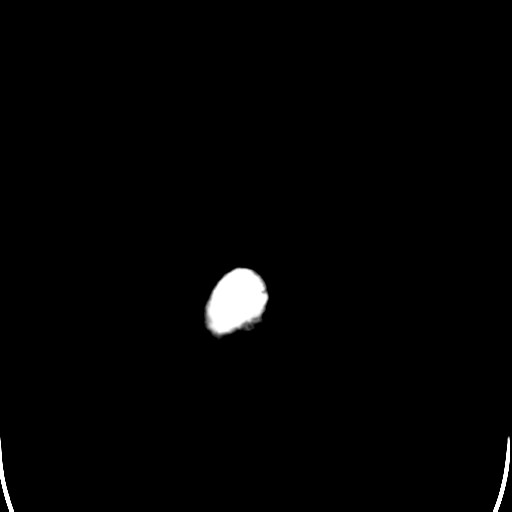
[im 30/33  bone]
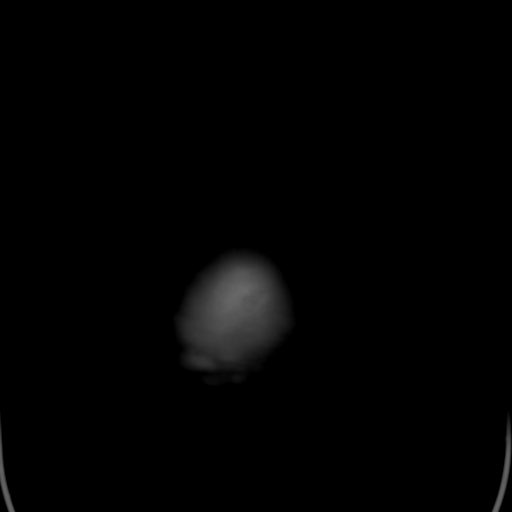

[Series 4: coronal soft tissue · coronal · 0.37mm/px · 3 of 68 slices shown]
[im 23/68  brain]
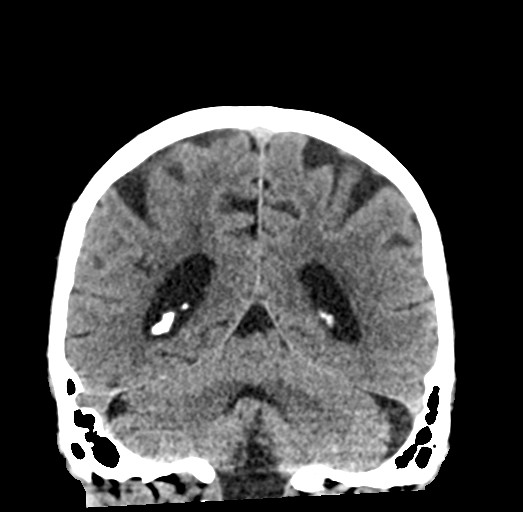
[im 30/68  brain]
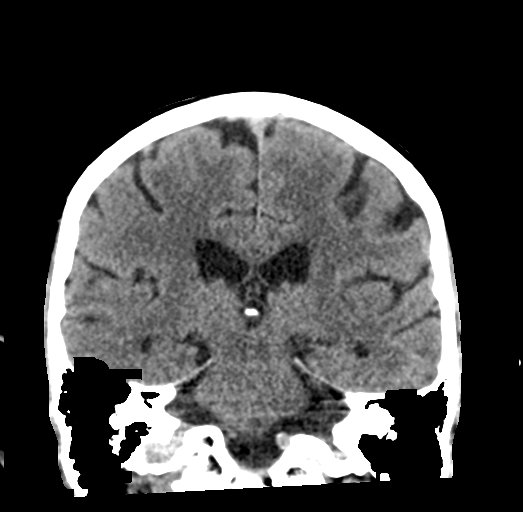
[im 38/68  brain]
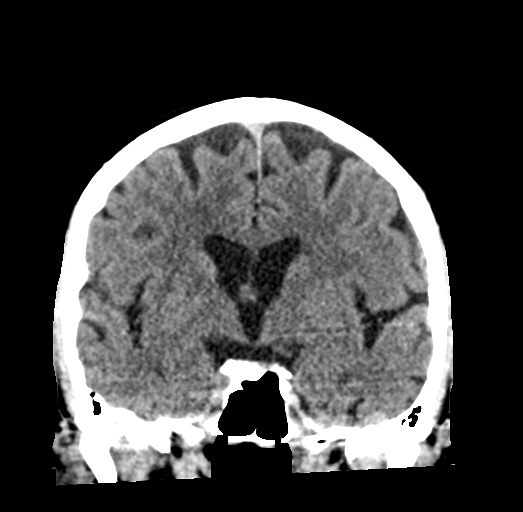

[Series 5: sagittal soft tissue · sagittal · 0.36mm/px · 3 of 59 slices shown]
[im 20/59  brain]
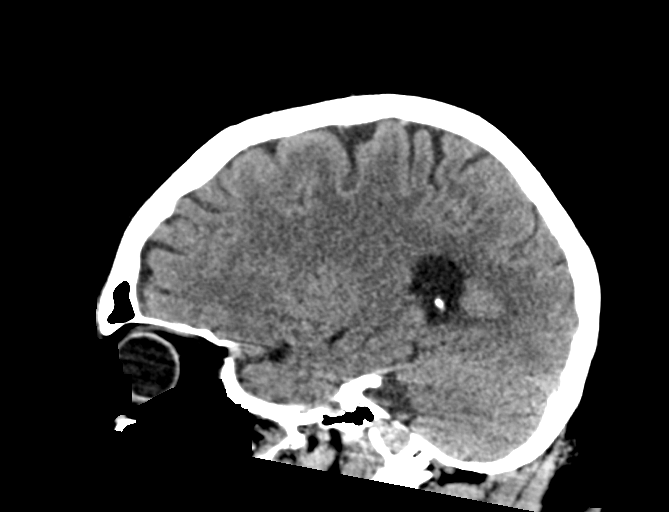
[im 30/59  brain]
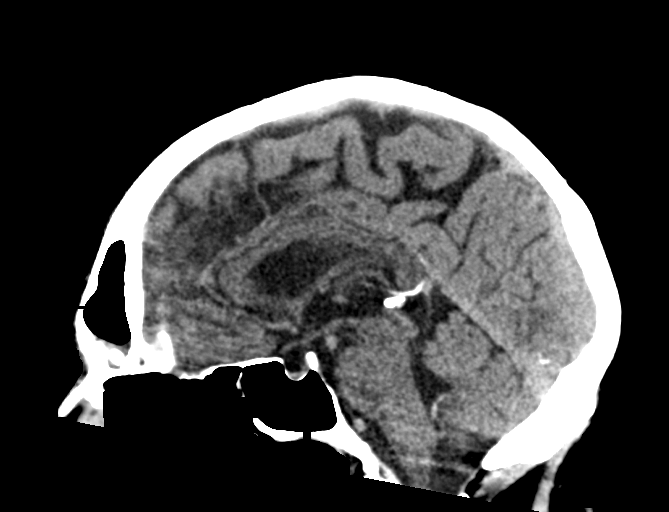
[im 39/59  brain]
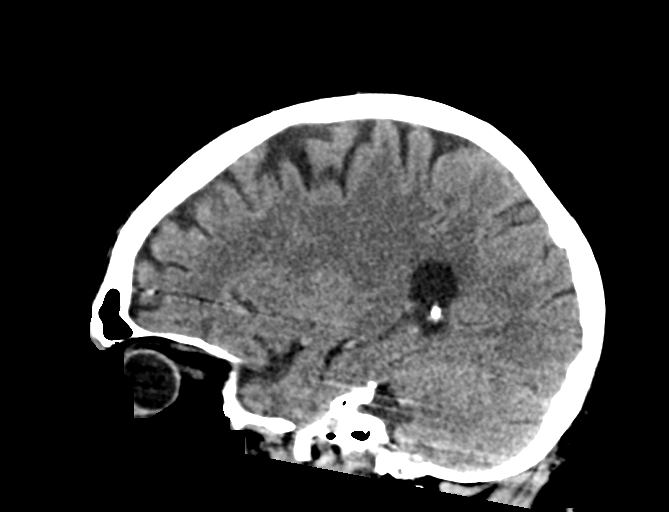

[15 of 47 positions shown; findings below may reference images not displayed]

FINDINGS: Brain: Mild involutional changes of the brain consistent with age.
Mild to moderate chronic small vessel and subcortical white matter
ischemic disease. No evidence of acute large vascular territory
infarction, hemorrhage, hydrocephalus, extra-axial collection or
mass lesion/mass effect.

Vascular: No hyperdense vessel or unexpected calcification.
Bilateral vertebral, basilar, carotid siphon, and circle of Willis
vascular calcifications.

Skull: Negative for fracture or focal lesion.

Sinuses/Orbits: Inspissated mucus in the left frontal sinus with
calcifications. A no acute sinusitis noted. The visualized ethmoid,
sphenoid and maxillary sinuses are clear. The mastoids are clear.
The orbits are unremarkable.

Other: None
IMPRESSION: Chronic small vessel ischemic disease of periventricular and
subcortical white matter.

Mild age related involutional changes of the brain.

No acute intracranial abnormality.

Inspissated mucus in the left frontal sinus.

## 2019-11-24 ENCOUNTER — Other Ambulatory Visit: Payer: Self-pay

## 2019-11-24 DIAGNOSIS — Z20822 Contact with and (suspected) exposure to covid-19: Secondary | ICD-10-CM

## 2019-11-26 LAB — NOVEL CORONAVIRUS, NAA

## 2019-11-27 ENCOUNTER — Ambulatory Visit: Payer: Self-pay

## 2019-11-27 NOTE — Telephone Encounter (Signed)
Provided covid test results .to Patient.  Voiced  Understanding.  Denies any Sx.  Reviewed covid Care advice and isolation.  Patient voiced understanding.

## 2019-11-30 ENCOUNTER — Telehealth: Payer: Self-pay

## 2019-11-30 NOTE — Telephone Encounter (Signed)
-----   Message from Kendrick Ranch, RN sent at 11/30/2019  6:53 AM EST ----- Can you follow up on this?  Thank you,  TP ----- Message ----- From: Wynona Luna, MD Sent: 11/29/2019   9:04 PM EST To: Kendrick Ranch, RN Subject: patient's result was indeterminate rather th#  (not sure that was conveyed)

## 2019-11-30 NOTE — Telephone Encounter (Signed)
Phone call to pt.  Explained that his recent [place] test was indeterminate; the test could not rule in or rule out COVID.  Advised pt. That he will need to retest.  Stated he was not having symptoms.  Reported he got tested due to indirect exposure to COVID.  Pt. Verb. Understanding of need to retest, and stated he will make arrangements to do so, tomorrow.

## 2019-12-01 ENCOUNTER — Other Ambulatory Visit: Payer: Self-pay | Admitting: *Deleted

## 2019-12-01 DIAGNOSIS — Z20822 Contact with and (suspected) exposure to covid-19: Secondary | ICD-10-CM

## 2019-12-03 LAB — NOVEL CORONAVIRUS, NAA: SARS-CoV-2, NAA: NOT DETECTED

## 2019-12-04 ENCOUNTER — Telehealth: Payer: Self-pay | Admitting: Internal Medicine

## 2019-12-04 NOTE — Telephone Encounter (Signed)
Negative COVID results given. Patient results "NOT Detected." Caller expressed understanding. ° °

## 2020-03-16 ENCOUNTER — Telehealth: Payer: Self-pay | Admitting: Dermatology

## 2020-03-16 NOTE — Telephone Encounter (Signed)
Pathology given to patient, he is healing well.

## 2020-03-16 NOTE — Telephone Encounter (Signed)
Patient calling to get results of biopsy. Chart [id number]

## 2021-01-16 DIAGNOSIS — E1169 Type 2 diabetes mellitus with other specified complication: Secondary | ICD-10-CM | POA: Diagnosis not present

## 2021-01-16 DIAGNOSIS — M1990 Unspecified osteoarthritis, unspecified site: Secondary | ICD-10-CM | POA: Diagnosis not present

## 2021-01-16 DIAGNOSIS — I10 Essential (primary) hypertension: Secondary | ICD-10-CM | POA: Diagnosis not present

## 2021-01-16 DIAGNOSIS — E7800 Pure hypercholesterolemia, unspecified: Secondary | ICD-10-CM | POA: Diagnosis not present

## 2021-03-03 DIAGNOSIS — I10 Essential (primary) hypertension: Secondary | ICD-10-CM | POA: Diagnosis not present

## 2021-03-03 DIAGNOSIS — E119 Type 2 diabetes mellitus without complications: Secondary | ICD-10-CM | POA: Diagnosis not present

## 2021-03-03 DIAGNOSIS — E1169 Type 2 diabetes mellitus with other specified complication: Secondary | ICD-10-CM | POA: Diagnosis not present

## 2021-03-03 DIAGNOSIS — E7800 Pure hypercholesterolemia, unspecified: Secondary | ICD-10-CM | POA: Diagnosis not present

## 2021-03-03 DIAGNOSIS — M1990 Unspecified osteoarthritis, unspecified site: Secondary | ICD-10-CM | POA: Diagnosis not present

## 2021-04-17 DIAGNOSIS — E1169 Type 2 diabetes mellitus with other specified complication: Secondary | ICD-10-CM | POA: Diagnosis not present

## 2021-04-17 DIAGNOSIS — I10 Essential (primary) hypertension: Secondary | ICD-10-CM | POA: Diagnosis not present

## 2021-04-17 DIAGNOSIS — L409 Psoriasis, unspecified: Secondary | ICD-10-CM | POA: Diagnosis not present

## 2021-04-17 DIAGNOSIS — E7800 Pure hypercholesterolemia, unspecified: Secondary | ICD-10-CM | POA: Diagnosis not present

## 2021-05-24 DIAGNOSIS — E7800 Pure hypercholesterolemia, unspecified: Secondary | ICD-10-CM | POA: Diagnosis not present

## 2021-05-24 DIAGNOSIS — E119 Type 2 diabetes mellitus without complications: Secondary | ICD-10-CM | POA: Diagnosis not present

## 2021-05-24 DIAGNOSIS — I10 Essential (primary) hypertension: Secondary | ICD-10-CM | POA: Diagnosis not present

## 2021-05-24 DIAGNOSIS — M1990 Unspecified osteoarthritis, unspecified site: Secondary | ICD-10-CM | POA: Diagnosis not present

## 2021-05-24 DIAGNOSIS — L4050 Arthropathic psoriasis, unspecified: Secondary | ICD-10-CM | POA: Diagnosis not present

## 2021-05-24 DIAGNOSIS — E1169 Type 2 diabetes mellitus with other specified complication: Secondary | ICD-10-CM | POA: Diagnosis not present

## 2021-06-28 ENCOUNTER — Encounter: Payer: Self-pay | Admitting: Physician Assistant

## 2021-06-28 ENCOUNTER — Ambulatory Visit: Payer: Medicare Other | Admitting: Physician Assistant

## 2021-06-28 ENCOUNTER — Other Ambulatory Visit: Payer: Self-pay

## 2021-06-28 DIAGNOSIS — D485 Neoplasm of uncertain behavior of skin: Secondary | ICD-10-CM

## 2021-06-28 DIAGNOSIS — D1721 Benign lipomatous neoplasm of skin and subcutaneous tissue of right arm: Secondary | ICD-10-CM | POA: Diagnosis not present

## 2021-06-28 NOTE — Progress Notes (Signed)
   Follow-Up Visit   Subjective  David Herrera is a 87 y.o. male who presents for the following: Skin Problem (Patient has a lump on left hand x 10 days. He said it has moved around on hand. Thinks it could be a cyst or something like that. ). No pain or symptoms.   The following portions of the chart were reviewed this encounter and updated as appropriate:  Tobacco  Allergies  Meds  Problems  Med Hx  Surg Hx  Fam Hx       Objective  Well appearing patient in no apparent distress; mood and affect are within normal limits.  A focused examination was performed including arms and hands. Relevant physical exam findings are noted in the Assessment and Plan.  Right Wrist - Posterior 4 cm soft, nonmobile mass      Assessment & Plan  Neoplasm of uncertain behavior of skin Right Wrist - Posterior  Skin / nail biopsy Type of biopsy: punch   Informed consent: discussed and consent obtained   Timeout: patient name, date of birth, surgical site, and procedure verified   Procedure prep:  Patient was prepped and draped in usual sterile fashion (Non sterile) Prep type:  Chlorhexidine Anesthesia: the lesion was anesthetized in a standard fashion   Anesthetic:  1% lidocaine w/ epinephrine 1-100,000 local infiltration Punch size:  4 mm Suture size:  4-0 Suture type: nylon   Suture removal (days):  14 Hemostasis achieved with: suture   Outcome: patient tolerated procedure well   Post-procedure details: sterile dressing applied and wound care instructions given   Dressing type: bandage and petrolatum   Additional details:  1 suture  Specimen 1 - Surgical pathology Differential Diagnosis: R/O Cyst vs Lipoma  Check Margins: No    I, Shoua Ulloa, PA-C, have reviewed all documentation's for this visit.  The documentation on 06/28/21 for the exam, diagnosis, procedures and orders are all accurate and complete.

## 2021-06-28 NOTE — Patient Instructions (Signed)

## 2021-07-11 ENCOUNTER — Ambulatory Visit (INDEPENDENT_AMBULATORY_CARE_PROVIDER_SITE_OTHER): Payer: Medicare Other | Admitting: *Deleted

## 2021-07-11 ENCOUNTER — Other Ambulatory Visit: Payer: Self-pay

## 2021-07-11 DIAGNOSIS — D1721 Benign lipomatous neoplasm of skin and subcutaneous tissue of right arm: Secondary | ICD-10-CM

## 2021-07-11 NOTE — Progress Notes (Signed)
Here for NTS suture removal. No signs or symptoms of infection. Path to patient. Patient does want to discuss removal of lipoma. Will send information to Hand surgeon Will Gramig. Faxed over all of patients info and pathology report to his office. Patient will all if he has any problems.

## 2021-07-21 DIAGNOSIS — E1169 Type 2 diabetes mellitus with other specified complication: Secondary | ICD-10-CM | POA: Diagnosis not present

## 2021-07-21 DIAGNOSIS — M1990 Unspecified osteoarthritis, unspecified site: Secondary | ICD-10-CM | POA: Diagnosis not present

## 2021-07-21 DIAGNOSIS — I10 Essential (primary) hypertension: Secondary | ICD-10-CM | POA: Diagnosis not present

## 2021-07-21 DIAGNOSIS — E119 Type 2 diabetes mellitus without complications: Secondary | ICD-10-CM | POA: Diagnosis not present

## 2021-07-21 DIAGNOSIS — L4050 Arthropathic psoriasis, unspecified: Secondary | ICD-10-CM | POA: Diagnosis not present

## 2021-07-21 DIAGNOSIS — E7800 Pure hypercholesterolemia, unspecified: Secondary | ICD-10-CM | POA: Diagnosis not present

## 2021-09-28 DIAGNOSIS — E1169 Type 2 diabetes mellitus with other specified complication: Secondary | ICD-10-CM | POA: Diagnosis not present

## 2021-09-28 DIAGNOSIS — M1990 Unspecified osteoarthritis, unspecified site: Secondary | ICD-10-CM | POA: Diagnosis not present

## 2021-09-28 DIAGNOSIS — L4050 Arthropathic psoriasis, unspecified: Secondary | ICD-10-CM | POA: Diagnosis not present

## 2021-09-28 DIAGNOSIS — E119 Type 2 diabetes mellitus without complications: Secondary | ICD-10-CM | POA: Diagnosis not present

## 2021-09-28 DIAGNOSIS — I10 Essential (primary) hypertension: Secondary | ICD-10-CM | POA: Diagnosis not present

## 2021-09-28 DIAGNOSIS — E7800 Pure hypercholesterolemia, unspecified: Secondary | ICD-10-CM | POA: Diagnosis not present

## 2021-10-19 DIAGNOSIS — Z1389 Encounter for screening for other disorder: Secondary | ICD-10-CM | POA: Diagnosis not present

## 2021-10-19 DIAGNOSIS — J309 Allergic rhinitis, unspecified: Secondary | ICD-10-CM | POA: Diagnosis not present

## 2021-10-19 DIAGNOSIS — E1169 Type 2 diabetes mellitus with other specified complication: Secondary | ICD-10-CM | POA: Diagnosis not present

## 2021-10-19 DIAGNOSIS — E7800 Pure hypercholesterolemia, unspecified: Secondary | ICD-10-CM | POA: Diagnosis not present

## 2021-10-19 DIAGNOSIS — Z23 Encounter for immunization: Secondary | ICD-10-CM | POA: Diagnosis not present

## 2021-10-19 DIAGNOSIS — I10 Essential (primary) hypertension: Secondary | ICD-10-CM | POA: Diagnosis not present

## 2021-10-19 DIAGNOSIS — L4050 Arthropathic psoriasis, unspecified: Secondary | ICD-10-CM | POA: Diagnosis not present

## 2021-10-19 DIAGNOSIS — Z0000 Encounter for general adult medical examination without abnormal findings: Secondary | ICD-10-CM | POA: Diagnosis not present

## 2021-10-19 DIAGNOSIS — L409 Psoriasis, unspecified: Secondary | ICD-10-CM | POA: Diagnosis not present

## 2022-04-19 DIAGNOSIS — E1169 Type 2 diabetes mellitus with other specified complication: Secondary | ICD-10-CM | POA: Diagnosis not present

## 2022-04-19 DIAGNOSIS — L4050 Arthropathic psoriasis, unspecified: Secondary | ICD-10-CM | POA: Diagnosis not present

## 2022-04-19 DIAGNOSIS — E7800 Pure hypercholesterolemia, unspecified: Secondary | ICD-10-CM | POA: Diagnosis not present

## 2022-04-19 DIAGNOSIS — H9190 Unspecified hearing loss, unspecified ear: Secondary | ICD-10-CM | POA: Diagnosis not present

## 2022-04-19 DIAGNOSIS — I10 Essential (primary) hypertension: Secondary | ICD-10-CM | POA: Diagnosis not present

## 2022-04-19 DIAGNOSIS — L409 Psoriasis, unspecified: Secondary | ICD-10-CM | POA: Diagnosis not present

## 2022-07-20 DIAGNOSIS — H9190 Unspecified hearing loss, unspecified ear: Secondary | ICD-10-CM | POA: Diagnosis not present

## 2022-07-20 DIAGNOSIS — E1169 Type 2 diabetes mellitus with other specified complication: Secondary | ICD-10-CM | POA: Diagnosis not present

## 2022-07-20 DIAGNOSIS — I10 Essential (primary) hypertension: Secondary | ICD-10-CM | POA: Diagnosis not present

## 2022-07-20 DIAGNOSIS — E7800 Pure hypercholesterolemia, unspecified: Secondary | ICD-10-CM | POA: Diagnosis not present

## 2022-07-20 DIAGNOSIS — L4050 Arthropathic psoriasis, unspecified: Secondary | ICD-10-CM | POA: Diagnosis not present

## 2022-08-30 DIAGNOSIS — J392 Other diseases of pharynx: Secondary | ICD-10-CM | POA: Diagnosis not present

## 2022-08-30 DIAGNOSIS — R490 Dysphonia: Secondary | ICD-10-CM | POA: Diagnosis not present

## 2022-09-20 DIAGNOSIS — E119 Type 2 diabetes mellitus without complications: Secondary | ICD-10-CM | POA: Diagnosis not present

## 2022-09-27 DIAGNOSIS — L400 Psoriasis vulgaris: Secondary | ICD-10-CM | POA: Diagnosis not present

## 2022-09-27 DIAGNOSIS — C44712 Basal cell carcinoma of skin of right lower limb, including hip: Secondary | ICD-10-CM | POA: Diagnosis not present

## 2022-10-18 DIAGNOSIS — Z85828 Personal history of other malignant neoplasm of skin: Secondary | ICD-10-CM | POA: Diagnosis not present

## 2022-10-18 DIAGNOSIS — Z08 Encounter for follow-up examination after completed treatment for malignant neoplasm: Secondary | ICD-10-CM | POA: Diagnosis not present

## 2022-10-23 DIAGNOSIS — J309 Allergic rhinitis, unspecified: Secondary | ICD-10-CM | POA: Diagnosis not present

## 2022-10-23 DIAGNOSIS — E1140 Type 2 diabetes mellitus with diabetic neuropathy, unspecified: Secondary | ICD-10-CM | POA: Diagnosis not present

## 2022-10-23 DIAGNOSIS — I10 Essential (primary) hypertension: Secondary | ICD-10-CM | POA: Diagnosis not present

## 2022-10-23 DIAGNOSIS — Z0000 Encounter for general adult medical examination without abnormal findings: Secondary | ICD-10-CM | POA: Diagnosis not present

## 2022-10-23 DIAGNOSIS — Z23 Encounter for immunization: Secondary | ICD-10-CM | POA: Diagnosis not present

## 2022-10-23 DIAGNOSIS — L409 Psoriasis, unspecified: Secondary | ICD-10-CM | POA: Diagnosis not present

## 2022-10-23 DIAGNOSIS — E7800 Pure hypercholesterolemia, unspecified: Secondary | ICD-10-CM | POA: Diagnosis not present

## 2022-10-23 DIAGNOSIS — L4050 Arthropathic psoriasis, unspecified: Secondary | ICD-10-CM | POA: Diagnosis not present

## 2022-11-05 DIAGNOSIS — Z08 Encounter for follow-up examination after completed treatment for malignant neoplasm: Secondary | ICD-10-CM | POA: Diagnosis not present

## 2022-11-05 DIAGNOSIS — Z85828 Personal history of other malignant neoplasm of skin: Secondary | ICD-10-CM | POA: Diagnosis not present

## 2023-01-07 DIAGNOSIS — Z08 Encounter for follow-up examination after completed treatment for malignant neoplasm: Secondary | ICD-10-CM | POA: Diagnosis not present

## 2023-01-07 DIAGNOSIS — Z85828 Personal history of other malignant neoplasm of skin: Secondary | ICD-10-CM | POA: Diagnosis not present

## 2023-04-23 DIAGNOSIS — E1140 Type 2 diabetes mellitus with diabetic neuropathy, unspecified: Secondary | ICD-10-CM | POA: Diagnosis not present

## 2023-04-23 DIAGNOSIS — L4050 Arthropathic psoriasis, unspecified: Secondary | ICD-10-CM | POA: Diagnosis not present

## 2023-04-23 DIAGNOSIS — I10 Essential (primary) hypertension: Secondary | ICD-10-CM | POA: Diagnosis not present

## 2023-04-23 DIAGNOSIS — L409 Psoriasis, unspecified: Secondary | ICD-10-CM | POA: Diagnosis not present

## 2023-04-23 DIAGNOSIS — J309 Allergic rhinitis, unspecified: Secondary | ICD-10-CM | POA: Diagnosis not present

## 2023-04-23 DIAGNOSIS — E7800 Pure hypercholesterolemia, unspecified: Secondary | ICD-10-CM | POA: Diagnosis not present

## 2023-07-10 ENCOUNTER — Encounter (HOSPITAL_COMMUNITY): Payer: Self-pay | Admitting: *Deleted

## 2023-07-10 ENCOUNTER — Emergency Department (HOSPITAL_COMMUNITY): Payer: Medicare Other

## 2023-07-10 ENCOUNTER — Other Ambulatory Visit: Payer: Self-pay

## 2023-07-10 ENCOUNTER — Emergency Department (HOSPITAL_COMMUNITY)
Admission: EM | Admit: 2023-07-10 | Discharge: 2023-07-10 | Disposition: A | Discharge status: Home / Self Care | Payer: Medicare Other | Attending: Emergency Medicine | Admitting: Emergency Medicine

## 2023-07-10 DIAGNOSIS — S42021A Displaced fracture of shaft of right clavicle, initial encounter for closed fracture: Secondary | ICD-10-CM | POA: Diagnosis not present

## 2023-07-10 DIAGNOSIS — Z7982 Long term (current) use of aspirin: Secondary | ICD-10-CM | POA: Insufficient documentation

## 2023-07-10 DIAGNOSIS — S51011A Laceration without foreign body of right elbow, initial encounter: Secondary | ICD-10-CM | POA: Diagnosis not present

## 2023-07-10 DIAGNOSIS — S42001A Fracture of unspecified part of right clavicle, initial encounter for closed fracture: Secondary | ICD-10-CM | POA: Insufficient documentation

## 2023-07-10 DIAGNOSIS — S59901A Unspecified injury of right elbow, initial encounter: Secondary | ICD-10-CM | POA: Diagnosis present

## 2023-07-10 DIAGNOSIS — Z743 Need for continuous supervision: Secondary | ICD-10-CM | POA: Diagnosis not present

## 2023-07-10 DIAGNOSIS — Z79899 Other long term (current) drug therapy: Secondary | ICD-10-CM | POA: Diagnosis not present

## 2023-07-10 DIAGNOSIS — W133XXA Fall through floor, initial encounter: Secondary | ICD-10-CM | POA: Insufficient documentation

## 2023-07-10 DIAGNOSIS — Y92009 Unspecified place in unspecified non-institutional (private) residence as the place of occurrence of the external cause: Secondary | ICD-10-CM | POA: Insufficient documentation

## 2023-07-10 DIAGNOSIS — M19011 Primary osteoarthritis, right shoulder: Secondary | ICD-10-CM | POA: Diagnosis not present

## 2023-07-10 DIAGNOSIS — M25521 Pain in right elbow: Secondary | ICD-10-CM | POA: Diagnosis not present

## 2023-07-10 DIAGNOSIS — Z23 Encounter for immunization: Secondary | ICD-10-CM | POA: Insufficient documentation

## 2023-07-10 DIAGNOSIS — M25511 Pain in right shoulder: Secondary | ICD-10-CM | POA: Diagnosis not present

## 2023-07-10 DIAGNOSIS — W19XXXA Unspecified fall, initial encounter: Secondary | ICD-10-CM | POA: Diagnosis not present

## 2023-07-10 DIAGNOSIS — Z043 Encounter for examination and observation following other accident: Secondary | ICD-10-CM | POA: Diagnosis not present

## 2023-07-10 DIAGNOSIS — I10 Essential (primary) hypertension: Secondary | ICD-10-CM | POA: Diagnosis not present

## 2023-07-10 MED ORDER — TETANUS-DIPHTH-ACELL PERTUSSIS 5-2.5-18.5 LF-MCG/0.5 IM SUSY
0.5000 mL | PREFILLED_SYRINGE | Freq: Once | INTRAMUSCULAR | Status: AC
  Administered 2023-07-10: 0.5 mL via INTRAMUSCULAR
  Filled 2023-07-10: qty 0.5

## 2023-07-10 NOTE — ED Triage Notes (Signed)
Pt BIB [facility] due to a fall, per report, pt with new shoes and tripped over his feet causing him to fall. Denies hitting his head, denies taking blood thinners.  C/o right shoulder pain and skin tear to right elbow.

## 2023-07-10 NOTE — ED Notes (Signed)
Patient transported to X-ray 

## 2023-07-10 NOTE — Discharge Instructions (Signed)
Again appointment to follow-up with orthopedics as listed above.  You will need to call for the appointment.  Wear the shoulder immobilizer as needed for comfort.  Meant extra strength Tylenol 2 tablets every 8 hours as needed for pain.  Also wound care for the abrasions wash daily with soap and water apply antibiotic ointment and then a new dressing.  Return for any new or worse symptoms.

## 2023-07-10 NOTE — ED Provider Notes (Addendum)
[facility]PARTMENT AT [facility] Provider Note   CSN: [id number] Arrival date & time: 07/10/23  1508     History  Chief Complaint  Patient presents with   David Herrera is a 89 y.o. male.  Patient with a fall at home on the tile floor of his house.  He was on the floor for about 30 minutes until EMS got there.  Patient with pain to his right shoulder.  No loss of consciousness.  Also has skin tears to the right elbow and right forearm.  Denies any hip or leg pain denies any head pain neck pain upper back or lower back pain.  States he did not hit his head.  He is not on blood thinners.  Patient lives by himself.  Patient states his tetanus is not up-to-date.  Patient feels like he kind of tripped and stumbled.  Does not think he passed out or had a near syncopal episode.       Home Medications Prior to Admission medications   Medication Sig Start Date End Date Taking? Authorizing Provider  acetaminophen (TYLENOL) 500 MG tablet Take 500 mg by mouth 2 (two) times daily as needed for mild pain.    [provider]  aspirin EC 81 MG tablet Take 81 mg by mouth daily.    [provider]  Cholecalciferol (VITAMIN D) 2000 UNITS CAPS Take 2,000 Units by mouth 2 (two) times daily.    [provider]  co-enzyme Q-10 30 MG capsule Take 30 mg by mouth daily.    [provider]  hydrochlorothiazide (HYDRODIURIL) 25 MG tablet Take 25 mg by mouth daily.    [provider]  losartan (COZAAR) 25 MG tablet Take 25 mg by mouth daily. 04/07/21   [provider]  meclizine (ANTIVERT) 25 MG tablet Take 1 tablet (25 mg total) by mouth 3 (three) times daily as needed for dizziness. 11/14/16   Bethann Berkshire, MD  metFORMIN (GLUCOPHAGE) 500 MG tablet 1 tablet daily. 10/29/16   [provider]  simvastatin (ZOCOR) 20 MG tablet Take 10 mg by mouth every evening.     [provider]      Allergies     Patient has no known allergies.    Review of Systems   Review of Systems  Constitutional:  Negative for chills and fever.  HENT:  Negative for ear pain and sore throat.   Eyes:  Negative for pain and visual disturbance.  Respiratory:  Negative for cough and shortness of breath.   Cardiovascular:  Negative for chest pain and palpitations.  Gastrointestinal:  Negative for abdominal pain and vomiting.  Genitourinary:  Negative for dysuria and hematuria.  Musculoskeletal:  Negative for arthralgias and back pain.  Skin:  Positive for wound. Negative for color change and rash.  Neurological:  Negative for seizures and syncope.  All other systems reviewed and are negative.   Physical Exam Updated Vital Signs BP 105/88   Pulse 82   Temp 98 F (36.7 C) (Oral)   Resp 19   Ht 1.727 m (5\' 8" )   Wt 70.3 kg   SpO2 95%   BMI 23.57 kg/m  Physical Exam Vitals and nursing note reviewed.  Constitutional:      General: He is not in acute distress.    Appearance: Normal appearance. He is well-developed. He is not ill-appearing.  HENT:     Head: Normocephalic and atraumatic.  Eyes:  Conjunctiva/sclera: Conjunctivae normal.     Pupils: Pupils are equal, round, and reactive to light.  Cardiovascular:     Rate and Rhythm: Normal rate and regular rhythm.     Heart sounds: No murmur heard. Pulmonary:     Effort: Pulmonary effort is normal. No respiratory distress.     Breath sounds: Normal breath sounds.     Comments: Tenderness to palpation to the right collarbone with deformity. Chest:     Chest wall: Tenderness present.  Abdominal:     Palpations: Abdomen is soft.     Tenderness: There is no abdominal tenderness.  Musculoskeletal:        General: Signs of injury present. No swelling.     Cervical back: Normal range of motion and neck supple. Tenderness present. No rigidity.     Comments: Skin tear with the skin completely torn off to the right lateral elbow this measuring about 3 x  4 cm.  Also small skin tear to the right forearm measuring about 3 cm.  Distally radial pulse is 2+ good movement of the fingers sensation intact.  Good movement at the wrist as well.  Some limited movement at the right shoulder and the right elbow  Skin:    General: Skin is warm and dry.     Capillary Refill: Capillary refill takes less than 2 seconds.  Neurological:     General: No focal deficit present.     Mental Status: He is alert.  Psychiatric:        Mood and Affect: Mood normal.     ED Results / Procedures / Treatments   Labs (all labs ordered are listed, but only abnormal results are displayed) Labs Reviewed - No data to display  EKG EKG Interpretation Date/Time:  Wednesday July 10 2023 16:08:22 EDT Ventricular Rate:  79 PR Interval:  133 QRS Duration:  98 QT Interval:  412 QTC Calculation: 473 R Axis:   -20  Text Interpretation: Sinus rhythm Borderline left axis deviation Confirmed by Vanetta Mulders [phone]) on 07/10/2023 4:40:33 PM  Radiology DG Clavicle Right  Result Date: 07/10/2023 CLINICAL DATA:  Fall. EXAM: RIGHT CLAVICLE - 2+ VIEWS COMPARISON:  August 25, 2013. FINDINGS: Acute moderately displaced and comminuted fracture is seen involving the midshaft of the right clavicle. This appears to be a refracture of the old fracture noted on prior radiograph of 2014. IMPRESSION: Acute moderately displaced and comminuted right midclavicular fracture. Electronically Signed   By: Lupita Raider M.D.   On: 07/10/2023 16:14   DG Elbow Complete Right  Result Date: 07/10/2023 CLINICAL DATA:  Right elbow pain after fall. EXAM: RIGHT ELBOW - COMPLETE 3+ VIEW COMPARISON:  None Available. FINDINGS: There is no evidence of fracture, dislocation, or joint effusion. Mild degenerative changes are seen between proximal ulna and humerus. Soft tissues are unremarkable. IMPRESSION: No acute abnormality seen. Electronically Signed   By: Lupita Raider M.D.   On: 07/10/2023 16:12   DG  Shoulder Right  Result Date: 07/10/2023 CLINICAL DATA:  Fall. EXAM: RIGHT SHOULDER - 2+ VIEW COMPARISON:  August 25, 2013. FINDINGS: There appears to be a moderately displaced and probably comminuted fracture involving the midshaft of the right clavicle. Mild degenerative changes seen involving the right acromioclavicular joint. The glenohumeral joint is unremarkable. IMPRESSION: Moderately displaced and probably comminuted right midclavicular fracture. Electronically Signed   By: Lupita Raider M.D.   On: 07/10/2023 16:10    Procedures Procedures    Medications Ordered in ED  Medications  Tdap (BOOSTRIX) injection 0.5 mL (0.5 mLs Intramuscular Given 07/10/23 1531)    ED Course/ Medical Decision Making/ A&P                             Medical Decision Making Amount and/or Complexity of Data Reviewed Radiology: ordered.  Risk Prescription drug management.   X-rays of the right shoulder to include clavicle and x-ray of the right elbow are pending.  Tetanus will be updated.  EKG ordered just to make sure no arrhythmia.  EKG without any acute findings.  X-ray of the elbow without any abnormalities.  X-ray of the right shoulder shows a moderately displaced comminuted right midclavicular fracture.  No shoulder dislocation.  Will treat with shoulder immobilizer and follow-up with orthopedics.  Patient had a skin tears dressed here by nursing.   Final Clinical Impression(s) / ED Diagnoses Final diagnoses:  Fall, initial encounter  Skin tear of right elbow without complication, initial encounter  Closed displaced fracture of right clavicle, unspecified part of clavicle, initial encounter    Rx / DC Orders ED Discharge Orders     None         Vanetta Mulders, MD 07/10/23 1544    Vanetta Mulders, MD 07/10/23 1758

## 2023-07-12 ENCOUNTER — Encounter: Payer: Self-pay | Admitting: Orthopedic Surgery

## 2023-07-12 ENCOUNTER — Ambulatory Visit: Payer: Medicare Other | Admitting: Orthopedic Surgery

## 2023-07-12 DIAGNOSIS — S42021A Displaced fracture of shaft of right clavicle, initial encounter for closed fracture: Secondary | ICD-10-CM | POA: Diagnosis not present

## 2023-07-12 NOTE — Patient Instructions (Signed)
Continue to use the sling to help with pain in the lower right arm.   Changed the pain on the right arm at least once a day.  Okay to let soap and water run over your injuries.  Limit how much you try and lift with the right arm.  Follow-up in 1 month.

## 2023-07-12 NOTE — Progress Notes (Signed)
New Patient Visit  Assessment: David Herrera is a 89 y.o. male with the following: 1. Closed displaced fracture of shaft of right clavicle, initial encounter  Plan: David Herrera fell recently, and sustained a right clavicle fracture.  He does have a history of clavicle fracture several years ago.  However, in comparing recent x-rays to prior x-rays, there appears to be an acute on chronic deformity of the right clavicle.  He is comfortable.  Continue with the sling at all times.  Limit how much you lift with the right arm.  Medications as needed.  In regards to his skin tears, dressings were removed, and there is no bleeding.  These are doing well overall.  Because he will be in a sling, we will continue with regular dressing changes.  Follow-up in 1 month.  Follow-up: Return in about 4 weeks (around 08/09/2023).  Subjective:  Chief Complaint  Patient presents with   Right shoulder pain    Recent fall, clavicle injury and skin tears.     History of Present Illness: David Herrera is a 89 y.o. male who presents for evaluation of right shoulder pain.  He is right-hand dominant.  He sustained a mechanical fall couple days ago.  He was seen in emergency department.  He was noted to have clavicle fracture.  He also had some skin tears.  He has been in a sling.  He is taking Tylenol or ibuprofen as needed.  No numbness or tingling.  No fevers or chills.   Review of Systems: No fevers or chills No numbness or tingling No chest pain No shortness of breath No bowel or bladder dysfunction No GI distress No headaches   Medical History:  Past Medical History:  Diagnosis Date   Basal cell carcinoma 06/14/2003   left flank   Basal cell carcinoma 06/14/2003   right upper back   Basal cell carcinoma 06/14/2003   left arm   Basal cell carcinoma 01/13/2017   left back of neck/hairline   Basal cell carcinoma 06/02/2015   right leg   Basal cell carcinoma 04/08/2018   left  chest   Diabetes mellitus without complication (HCC)    DJD (degenerative joint disease) of hip    right   HOH (hard of hearing)    Hypertension    Melanoma (HCC) 06/14/2003   let outer back   Rosacea    Rosacea    Vertigo     Past Surgical History:  Procedure Laterality Date   CATARACT EXTRACTION W/PHACO Right 06/03/2014   Procedure: CATARACT EXTRACTION PHACO AND INTRAOCULAR LENS PLACEMENT (IOC);  Surgeon: Gemma Payor, MD;  Location: [facility];  Service: Ophthalmology;  Laterality: Right;  CDE:16.07   CATARACT EXTRACTION W/PHACO Left 06/21/2014   Procedure: CATARACT EXTRACTION PHACO AND INTRAOCULAR LENS PLACEMENT (IOC);  Surgeon: Gemma Payor, MD;  Location: [facility];  Service: Ophthalmology;  Laterality: Left;  CDE:  6.80   NO PAST SURGERIES     TOTAL HIP ARTHROPLASTY Right 09/02/2013   Procedure: TOTAL HIP ARTHROPLASTY;  Surgeon: Loreta Ave, MD;  Location: [facility] OR;  Service: Orthopedics;  Laterality: Right;    Family History  Problem Relation Age of Onset   Diabetes Unknown    Social History   Tobacco Use   Smoking status: Never   Smokeless tobacco: Never  Substance Use Topics   Alcohol use: No   Drug use: No    No Known Allergies  Current Meds  Medication Sig   acetaminophen (TYLENOL)  500 MG tablet Take 500 mg by mouth 2 (two) times daily as needed for mild pain.   aspirin EC 81 MG tablet Take 81 mg by mouth daily.   Cholecalciferol (VITAMIN D) 2000 UNITS CAPS Take 2,000 Units by mouth 2 (two) times daily.   co-enzyme Q-10 30 MG capsule Take 30 mg by mouth daily.   hydrochlorothiazide (HYDRODIURIL) 25 MG tablet Take 25 mg by mouth daily.   losartan (COZAAR) 25 MG tablet Take 25 mg by mouth daily.   meclizine (ANTIVERT) 25 MG tablet Take 1 tablet (25 mg total) by mouth 3 (three) times daily as needed for dizziness.   metFORMIN (GLUCOPHAGE) 500 MG tablet 1 tablet daily.   simvastatin (ZOCOR) 20 MG tablet Take 10 mg by mouth every evening.     Objective: There were no  vitals taken for this visit.  Physical Exam:  General: Alert and oriented. and No acute distress. Gait: Normal gait.  Evaluation of right shoulder demonstrates bruising over the anterior and superior chest.  He does have mild tenderness palpation with a mild deformity of the mid clavicle.  Fingers are warm well-perfused.  Sensation intact throughout the right shoulder.  Sensation intact in the axillary nerve distribution.  2+ radial pulse.  Skin tear over the distal and dorsal elbow is without obvious bruising.  No scabbing.  Skin tear over the dorsal wrist is also without bleeding.  IMAGING: I personally reviewed images previously obtained from the ED   Trays from the emergency department were evaluated in clinic.  There is an acute deformity in addition to chronic deformity.   New Medications:  No orders of the defined types were placed in this encounter.     Oliver Barre, MD  07/12/2023 11:15 AM

## 2023-08-09 ENCOUNTER — Encounter: Payer: Medicare Other | Admitting: Orthopedic Surgery

## 2023-08-09 ENCOUNTER — Encounter: Payer: Self-pay | Admitting: Orthopedic Surgery

## 2023-08-09 ENCOUNTER — Other Ambulatory Visit (INDEPENDENT_AMBULATORY_CARE_PROVIDER_SITE_OTHER): Payer: Medicare Other

## 2023-08-09 ENCOUNTER — Ambulatory Visit (INDEPENDENT_AMBULATORY_CARE_PROVIDER_SITE_OTHER): Payer: Medicare Other | Admitting: Orthopedic Surgery

## 2023-08-09 VITALS — BP 132/66 | HR 95 | Ht 65.5 in | Wt 156.4 lb

## 2023-08-09 DIAGNOSIS — S42021A Displaced fracture of shaft of right clavicle, initial encounter for closed fracture: Secondary | ICD-10-CM | POA: Diagnosis not present

## 2023-08-09 DIAGNOSIS — S42021D Displaced fracture of shaft of right clavicle, subsequent encounter for fracture with routine healing: Secondary | ICD-10-CM

## 2023-08-09 NOTE — Progress Notes (Signed)
Return patient Visit  Assessment: David Herrera is a 89 y.o. male with the following: 1. Closed displaced fracture of shaft of right clavicle, subsequent encounter  Plan: David Herrera sustained a right clavicle fracture, approximately 1 month ago.  Radiographs remained stable.  His pain is getting better, although he still having some pain.  The more distal skin tear has healed on the right arm, but the skin tear around the right elbow in the antecubital fossa remains irritated and draining.  At this point, I have encouraged him to allow the skin tear to dry.  I think the prior dressings shifted, and has created more issues including maceration and irritation of the skin in the antecubital fossa.  I placed a soft dressing in clinic today.  He was fitted for a new sling, as the other sling has been soiled from drainage from his wounds.  I would like see him back in 2 weeks for repeat evaluation.  Follow-up: No follow-ups on file.  Subjective:  Chief Complaint  Patient presents with   Shoulder Pain    R/ Shoulder is sore, taking Ibuprofen for pain.      History of Present Illness: David Herrera is a 89 y.o. male who returns for evaluation of right shoulder pain.  He is right-hand dominant.  He sustained a right clavicle fracture, approximately 1 month ago.  His pain is improving.  He does continue to have some pain in the right shoulder.  He is taking ibuprofen as needed.  No numbness or tingling.  Skin tears are being treated in the right arm.  The distal skin tear has healed and dried up.  He continues to have drainage from the right elbow.  No fevers or chills.  Review of Systems: No fevers or chills No numbness or tingling No chest pain No shortness of breath No bowel or bladder dysfunction No GI distress No headaches    Objective: BP 132/66   Pulse 95   Ht 5' 5.5" (1.664 m)   Wt 156 lb 6 oz (70.9 kg)   BMI 25.63 kg/m   Physical Exam:  General: Alert and  oriented. and No acute distress. Gait: Normal gait.  Right shoulder without deformity.  improved ecchymosis.  Deformity of clavicle is palpated, with minimal pain.  No gross motion.  Skin tear over the elbow with macerated skin.  There is some drainage.  Some bleeding.  Fingers warm well-perfused.  He tolerates gentle range of motion.  IMAGING: I personally ordered and reviewed the following images  X-rays of the right clavicle were obtained in clinic today.  These are compared to prior x-rays.  There is a fracture of the mid right clavicle shaft.  No interval displacement.  There is some interval callus formation.  No additional injuries are noted.  Glenohumeral joint is reduced.  Impression: Healing right midshaft clavicle fracture without further displacement.  New Medications:  No orders of the defined types were placed in this encounter.     Oliver Barre, MD  08/09/2023 10:12 AM

## 2023-08-23 ENCOUNTER — Emergency Department (HOSPITAL_COMMUNITY): Payer: Medicare Other

## 2023-08-23 ENCOUNTER — Observation Stay (HOSPITAL_COMMUNITY)
Admission: EM | Admit: 2023-08-23 | Discharge: 2023-09-03 | Disposition: A | Discharge status: Skilled Nursing Facility | Payer: Medicare Other | Attending: Internal Medicine | Admitting: Internal Medicine

## 2023-08-23 ENCOUNTER — Other Ambulatory Visit: Payer: Self-pay

## 2023-08-23 ENCOUNTER — Other Ambulatory Visit (INDEPENDENT_AMBULATORY_CARE_PROVIDER_SITE_OTHER): Payer: Medicare Other

## 2023-08-23 ENCOUNTER — Ambulatory Visit (INDEPENDENT_AMBULATORY_CARE_PROVIDER_SITE_OTHER): Payer: Medicare Other | Admitting: Orthopedic Surgery

## 2023-08-23 ENCOUNTER — Encounter (HOSPITAL_COMMUNITY): Payer: Self-pay | Admitting: *Deleted

## 2023-08-23 ENCOUNTER — Encounter: Payer: Self-pay | Admitting: Orthopedic Surgery

## 2023-08-23 DIAGNOSIS — R29818 Other symptoms and signs involving the nervous system: Secondary | ICD-10-CM | POA: Diagnosis not present

## 2023-08-23 DIAGNOSIS — R531 Weakness: Principal | ICD-10-CM

## 2023-08-23 DIAGNOSIS — E782 Mixed hyperlipidemia: Secondary | ICD-10-CM | POA: Insufficient documentation

## 2023-08-23 DIAGNOSIS — Z7984 Long term (current) use of oral hypoglycemic drugs: Secondary | ICD-10-CM | POA: Diagnosis not present

## 2023-08-23 DIAGNOSIS — S42021D Displaced fracture of shaft of right clavicle, subsequent encounter for fracture with routine healing: Secondary | ICD-10-CM

## 2023-08-23 DIAGNOSIS — I10 Essential (primary) hypertension: Secondary | ICD-10-CM | POA: Diagnosis present

## 2023-08-23 DIAGNOSIS — L8990 Pressure ulcer of unspecified site, unspecified stage: Secondary | ICD-10-CM | POA: Diagnosis not present

## 2023-08-23 DIAGNOSIS — E538 Deficiency of other specified B group vitamins: Secondary | ICD-10-CM | POA: Insufficient documentation

## 2023-08-23 DIAGNOSIS — E1165 Type 2 diabetes mellitus with hyperglycemia: Secondary | ICD-10-CM | POA: Insufficient documentation

## 2023-08-23 DIAGNOSIS — Z79899 Other long term (current) drug therapy: Secondary | ICD-10-CM | POA: Insufficient documentation

## 2023-08-23 DIAGNOSIS — E440 Moderate protein-calorie malnutrition: Secondary | ICD-10-CM | POA: Insufficient documentation

## 2023-08-23 DIAGNOSIS — R0602 Shortness of breath: Secondary | ICD-10-CM | POA: Insufficient documentation

## 2023-08-23 DIAGNOSIS — Z85828 Personal history of other malignant neoplasm of skin: Secondary | ICD-10-CM | POA: Diagnosis not present

## 2023-08-23 DIAGNOSIS — E860 Dehydration: Secondary | ICD-10-CM | POA: Insufficient documentation

## 2023-08-23 DIAGNOSIS — D638 Anemia in other chronic diseases classified elsewhere: Secondary | ICD-10-CM | POA: Insufficient documentation

## 2023-08-23 DIAGNOSIS — E871 Hypo-osmolality and hyponatremia: Secondary | ICD-10-CM | POA: Insufficient documentation

## 2023-08-23 DIAGNOSIS — Z7982 Long term (current) use of aspirin: Secondary | ICD-10-CM | POA: Diagnosis not present

## 2023-08-23 DIAGNOSIS — I700 Atherosclerosis of aorta: Secondary | ICD-10-CM | POA: Diagnosis not present

## 2023-08-23 DIAGNOSIS — S42021K Displaced fracture of shaft of right clavicle, subsequent encounter for fracture with nonunion: Secondary | ICD-10-CM | POA: Diagnosis not present

## 2023-08-23 DIAGNOSIS — Z96641 Presence of right artificial hip joint: Secondary | ICD-10-CM | POA: Diagnosis not present

## 2023-08-23 DIAGNOSIS — E876 Hypokalemia: Secondary | ICD-10-CM | POA: Insufficient documentation

## 2023-08-23 DIAGNOSIS — R29898 Other symptoms and signs involving the musculoskeletal system: Secondary | ICD-10-CM | POA: Diagnosis present

## 2023-08-23 LAB — COMPREHENSIVE METABOLIC PANEL
ALT: 18 U/L (ref 0–44)
AST: 18 U/L (ref 15–41)
Albumin: 3.5 g/dL (ref 3.5–5.0)
Alkaline Phosphatase: 92 U/L (ref 38–126)
Anion gap: 12 (ref 5–15)
BUN: 19 mg/dL (ref 8–23)
CO2: 23 mmol/L (ref 22–32)
Calcium: 8.6 mg/dL — ABNORMAL LOW (ref 8.9–10.3)
Chloride: 96 mmol/L — ABNORMAL LOW (ref 98–111)
Creatinine, Ser: 0.74 mg/dL (ref 0.61–1.24)
GFR, Estimated: >60 mL/min (ref >60)
Glucose, Bld: 177 mg/dL — ABNORMAL HIGH (ref 70–99)
Potassium: 3.3 mmol/L — ABNORMAL LOW (ref 3.5–5.1)
Sodium: 131 mmol/L — ABNORMAL LOW (ref 135–145)
Total Bilirubin: 0.6 mg/dL (ref 0.3–1.2)
Total Protein: 6.4 g/dL — ABNORMAL LOW (ref 6.5–8.1)

## 2023-08-23 LAB — CBC WITH DIFFERENTIAL/PLATELET
Abs Immature Granulocytes: 0.02 10*3/uL (ref 0.00–0.07)
Basophils Absolute: 0 10*3/uL (ref 0.0–0.1)
Basophils Relative: 1 %
Eosinophils Absolute: 0.1 10*3/uL (ref 0.0–0.5)
Eosinophils Relative: 2 %
HCT: 35.8 % — ABNORMAL LOW (ref 39.0–52.0)
Hemoglobin: 12 g/dL — ABNORMAL LOW (ref 13.0–17.0)
Immature Granulocytes: 0 %
Lymphocytes Relative: 16 %
Lymphs Abs: 0.9 10*3/uL (ref 0.7–4.0)
MCH: 30.7 pg (ref 26.0–34.0)
MCHC: 33.5 g/dL (ref 30.0–36.0)
MCV: 91.6 fL (ref 80.0–100.0)
Monocytes Absolute: 0.6 10*3/uL (ref 0.1–1.0)
Monocytes Relative: 12 %
Neutro Abs: 3.8 10*3/uL (ref 1.7–7.7)
Neutrophils Relative %: 69 %
Platelets: 207 10*3/uL (ref 150–400)
RBC: 3.91 MIL/uL — ABNORMAL LOW (ref 4.22–5.81)
RDW: 13.2 % (ref 11.5–15.5)
WBC: 5.5 10*3/uL (ref 4.0–10.5)
nRBC: 0 % (ref 0.0–0.2)

## 2023-08-23 LAB — URINALYSIS, ROUTINE W REFLEX MICROSCOPIC
Bacteria, UA: NONE SEEN
Bilirubin Urine: NEGATIVE
Glucose, UA: NEGATIVE mg/dL
Ketones, ur: NEGATIVE mg/dL
Leukocytes,Ua: NEGATIVE
Nitrite: NEGATIVE
Protein, ur: NEGATIVE mg/dL
Specific Gravity, Urine: 1.01 (ref 1.005–1.030)
pH: 6 (ref 5.0–8.0)

## 2023-08-23 LAB — TSH: TSH: 1.648 u[IU]/mL (ref 0.350–4.500)

## 2023-08-23 LAB — BRAIN NATRIURETIC PEPTIDE: B Natriuretic Peptide: 80 pg/mL (ref 0.0–100.0)

## 2023-08-23 LAB — MAGNESIUM: Magnesium: 1.7 mg/dL (ref 1.7–2.4)

## 2023-08-23 MED ORDER — POTASSIUM CHLORIDE 10 MEQ/100ML IV SOLN
10.0000 meq | INTRAVENOUS | Status: AC
  Administered 2023-08-24 (×3): 10 meq via INTRAVENOUS
  Filled 2023-08-23 (×3): qty 100

## 2023-08-23 NOTE — H&P (Incomplete)
History and Physical    Patient: David Herrera DGU:[id number] DOB: 06-17-1933 DOA: 08/23/2023 DOS: the patient was seen and examined on 08/24/2023 PCP: Georgann Housekeeper, MD  Patient coming from: Home  Chief Complaint:  Chief Complaint  Patient presents with   unable to walk for past week   HPI: David Herrera is an 89 y.o. male with medical history significant of hypertension, hyperlipidemia, T2DM who presents to the emergency department due to generalized weakness that has been ongoing since last 2 weeks which has since progressed to inability to walk.  He uses a cane at baseline.  Patient sustained right clavicle fracture about 6 weeks ago, he followed up with his orthopedic surgeon today and stable right clavicle shaft fracture was noted.  He was advised to gradually increase activity. Patient denies fever, chills, falls, decrease in appetite, depression.  ED Course:  In the emergency department, BP was 115/57, other vital signs were within normal range.  Workup in the ED showed normocytic anemia, BMP showed sodium 131, potassium 3.3, chloride 96, bicarb 23, blood glucose 177, BUN 19, creatinine 0.74.  BNP 80, TSH 1.648, urinalysis was normal, magnesium 1.7 CT of head showed no acute intracranial abnormality Chest x-ray showed no active disease. Potassium was replenished.  Hospitalist was asked to admit patient for further evaluation and management.   Review of Systems: Review of systems as noted in the HPI. All other systems reviewed and are negative.   Past Medical History:  Diagnosis Date   Basal cell carcinoma 06/14/2003   left flank   Basal cell carcinoma 06/14/2003   right upper back   Basal cell carcinoma 06/14/2003   left arm   Basal cell carcinoma 01/13/2017   left back of neck/hairline   Basal cell carcinoma 06/02/2015   right leg   Basal cell carcinoma 04/08/2018   left chest   Diabetes mellitus without complication (HCC)    DJD (degenerative joint  disease) of hip    right   HOH (hard of hearing)    Hypertension    Melanoma (HCC) 06/14/2003   let outer back   Rosacea    Rosacea    Vertigo    Past Surgical History:  Procedure Laterality Date   CATARACT EXTRACTION W/PHACO Right 06/03/2014   Procedure: CATARACT EXTRACTION PHACO AND INTRAOCULAR LENS PLACEMENT (IOC);  Surgeon: Gemma Payor, MD;  Location: [facility];  Service: Ophthalmology;  Laterality: Right;  CDE:16.07   CATARACT EXTRACTION W/PHACO Left 06/21/2014   Procedure: CATARACT EXTRACTION PHACO AND INTRAOCULAR LENS PLACEMENT (IOC);  Surgeon: Gemma Payor, MD;  Location: [facility];  Service: Ophthalmology;  Laterality: Left;  CDE:  6.80   NO PAST SURGERIES     TOTAL HIP ARTHROPLASTY Right 09/02/2013   Procedure: TOTAL HIP ARTHROPLASTY;  Surgeon: Loreta Ave, MD;  Location: [facility];  Service: Orthopedics;  Laterality: Right;    Social History:  reports that he has never smoked. He has never used smokeless tobacco. He reports that he does not drink alcohol and does not use drugs.   No Known Allergies  Family History  Problem Relation Age of Onset   Diabetes Unknown      Prior to Admission medications   Medication Sig Start Date End Date Taking? Authorizing Provider  acetaminophen (TYLENOL) 500 MG tablet Take 500 mg by mouth 2 (two) times daily as needed for mild pain.    [provider]  aspirin EC 81 MG tablet Take 81 mg by mouth daily.  [provider]  Cholecalciferol (VITAMIN D) 2000 UNITS CAPS Take 2,000 Units by mouth 2 (two) times daily.    [provider]  co-enzyme Q-10 30 MG capsule Take 30 mg by mouth daily.    [provider]  hydrochlorothiazide (HYDRODIURIL) 25 MG tablet Take 25 mg by mouth daily.    [provider]  losartan (COZAAR) 25 MG tablet Take 25 mg by mouth daily. 04/07/21   [provider]  meclizine (ANTIVERT) 25 MG tablet Take 1 tablet (25 mg total) by mouth 3 (three) times daily as needed for  dizziness. 11/14/16   Bethann Berkshire, MD  metFORMIN (GLUCOPHAGE) 500 MG tablet 1 tablet daily. 10/29/16   [provider]  simvastatin (ZOCOR) 20 MG tablet Take 10 mg by mouth every evening.     [provider]    Physical Exam: BP (!) 147/72 (BP Location: Right Arm)   Pulse 84   Temp 98 F (36.7 C) (Oral)   Resp 17   Wt 71.3 kg   SpO2 99%   BMI 25.76 kg/m   General: 89 y.o. year-old male well developed well nourished in no acute distress.  Alert and oriented x3. HEENT: NCAT, EOMI, dry mucous membrane. Neck: Supple, trachea medial Cardiovascular: Regular rate and rhythm with no rubs or gallops.  No thyromegaly or JVD noted.  +2 lower extremity edema. 2/4 pulses in all 4 extremities. Respiratory: Clear to auscultation with no wheezes or rales. Good inspiratory effort. Abdomen: Soft, nontender nondistended with normal bowel sounds x4 quadrants. Muskuloskeletal: No cyanosis, clubbing noted bilaterally Neuro: CN II-XII intact, strength 5/5 x 4, sensation, reflexes intact Skin: No ulcerative lesions noted or rashes Psychiatry:  Mood is appropriate for condition and setting          Labs on Admission:  Basic Metabolic Panel: Recent Labs  Lab 08/23/23 1951  NA 131*  K 3.3*  CL 96*  CO2 23  GLUCOSE 177*  BUN 19  CREATININE 0.74  CALCIUM 8.6*  MG 1.7   Liver Function Tests: Recent Labs  Lab 08/23/23 1951  AST 18  ALT 18  ALKPHOS 92  BILITOT 0.6  PROT 6.4*  ALBUMIN 3.5   No results for input(s): "LIPASE", "AMYLASE" in the last 168 hours. No results for input(s): "AMMONIA" in the last 168 hours. CBC: Recent Labs  Lab 08/23/23 1951  WBC 5.5  NEUTROABS 3.8  HGB 12.0*  HCT 35.8*  MCV 91.6  PLT 207   Cardiac Enzymes: No results for input(s): "CKTOTAL", "CKMB", "CKMBINDEX", "TROPONINI" in the last 168 hours.  BNP (last 3 results) Recent Labs    08/23/23 1951  BNP 80.0    ProBNP (last 3 results) No results for input(s): "PROBNP" in the  last 8760 hours.  CBG: No results for input(s): "GLUCAP" in the last 168 hours.  Radiological Exams on Admission: CT Head Wo Contrast  Result Date: 08/23/2023 CLINICAL DATA:  Neuro deficit, acute, stroke suspected EXAM: CT HEAD WITHOUT CONTRAST TECHNIQUE: Contiguous axial images were obtained from the base of the skull through the vertex without intravenous contrast. RADIATION DOSE REDUCTION: This exam was performed according to the departmental dose-optimization program which includes automated exposure control, adjustment of the mA and/or kV according to patient size and/or use of iterative reconstruction technique. COMPARISON:  11/14/2016 FINDINGS: Brain: There is atrophy and chronic small vessel disease changes. No acute intracranial abnormality. Specifically, no hemorrhage, hydrocephalus, mass lesion, acute infarction, or significant intracranial injury. Vascular: No hyperdense vessel or unexpected calcification.  Skull: No acute calvarial abnormality. Sinuses/Orbits: Inspissated mucus in the left frontal sinus with calcifications, unchanged. No air-fluid levels. Mastoid air cells clear. Other: None IMPRESSION: Atrophy, chronic microvascular disease. No acute intracranial abnormality. Electronically Signed   By: Charlett Nose M.D.   On: 08/23/2023 23:29   DG Shoulder Right  Result Date: 08/23/2023 X-rays of the right shoulder were obtained in clinic today.  These are compared to previous x-rays.  Fracture of the mid clavicle shaft remains in stable position.  There is some callus formation.  No further injuries.  Shoulder is reduced.  Minimal degenerative changes.  Impression: Stable right clavicle shaft fracture   DG Chest Portable 1 View  Result Date: 08/23/2023 CLINICAL DATA:  Weakness EXAM: PORTABLE CHEST 1 VIEW COMPARISON:  08/25/2013 FINDINGS: Ununited displaced right mid clavicular fracture. No acute airspace disease. Stable cardiomediastinal silhouette with aortic atherosclerosis. Old  appearing deformity of the proximal left humerus. IMPRESSION: 1. No active disease. Electronically Signed   By: Jasmine Pang M.D.   On: 08/23/2023 19:49    EKG: I independently viewed the EKG done and my findings are as followed: Sinus rhythm at a rate of 87 bpm  Assessment/Plan Present on Admission:  Weakness of right leg  Essential hypertension  Principal Problem:   Generalized weakness Active Problems:   Essential hypertension   Weakness of right leg   Dehydration   Anemia of chronic disease   Hypokalemia   Hyponatremia   Type 2 diabetes mellitus with hyperglycemia (HCC)   Mixed hyperlipidemia  Generalized weakness Difficulty in ambulation The cause of patient's generalized weakness is unknown at this time Continue fall precautions B12 and folate levels will be checked in the morning Continue PT/OT eval and treat  Dehydration Continue IV hydration  Anemia of chronic disease Stable  Hypokalemia K+ 3.1, this will be replenished  Hyponatremia possibly due to dehydration Na 131. Continue gentle hydration Continue to monitor sodium with serial BMPs Urine osmolality, serum osmolality and urine sodium will be checked  T2DM with hyperglycemia Continue ISS and hypoglycemia protocol  Essential hypertension Continue losartan  Mixed hyperlipidemia Continue Zocor  DVT prophylaxis: Lovenox   Advance Care Planning:   Code Status: Full Code   Consults: None  Family Communication: Son at bedside, all questions answered to satisfaction  Severity of Illness: The appropriate patient status for this patient is INPATIENT. Inpatient status is judged to be reasonable and necessary in order to provide the required intensity of service to ensure the patient's safety. The patient's presenting symptoms, physical exam findings, and initial radiographic and laboratory data in the context of their chronic comorbidities is felt to place them at high risk for further clinical  deterioration. Furthermore, it is not anticipated that the patient will be medically stable for discharge from the hospital within 2 midnights of admission.   * I certify that at the point of admission it is my clinical judgment that the patient will require inpatient hospital care spanning beyond 2 midnights from the point of admission due to high intensity of service, high risk for further deterioration and high frequency of surveillance required.*  Author: Frankey Shown, DO 08/24/2023 2:58 AM  For on call review [email or web address].

## 2023-08-23 NOTE — Progress Notes (Signed)
Return patient Visit  Assessment: David Herrera is a 89 y.o. male with the following: 1. Closed displaced fracture of shaft of right clavicle, subsequent encounter  Plan: David Herrera sustained a right clavicle fracture, approximately 6 weeks ago.  Radiographs are stable.  According to his son, he is already been using the arm on a regular basis.  Macerated skin in the antecubital fossa looks much better.  He has no tenderness to palpation.  Sling can be removed.  Gradual increase in activity.  They are comfortable returning as needed.   Follow-up: Return if symptoms worsen or fail to improve.  Subjective:  Chief Complaint  Patient presents with   Fracture    R clavicle     History of Present Illness: David Herrera is a 89 y.o. male who returns for evaluation of right shoulder pain.  He fell and injured his right clavicle approximately 6 weeks ago.  He has been in the sling.  At the last visit, he had a lot of skin irritation and macerated skin in the antecubital fossa from dressings following skin tears.  No dressings currently.  According to his son, he has been using his shoulder.  He has not been in the sling at all times.   Review of Systems: No fevers or chills No numbness or tingling No chest pain No shortness of breath No bowel or bladder dysfunction No GI distress No headaches    Objective: There were no vitals taken for this visit.  Physical Exam:  General: Alert and oriented. and No acute distress. Gait: Seated in a wheelchair  Right shoulder with mild deformity over the clavicle.  No tenderness to palpation.  He has good range of motion.  Fingers warm and well-perfused.  Skin in the antecubital fossa looks very good.  No drainage.  No bleeding.  Fingers are warm and well-perfused.  IMAGING: I personally ordered and reviewed the following images  X-rays of the right shoulder were obtained in clinic today.  These are compared to previous  x-rays.  Fracture of the mid clavicle shaft remains in stable position.  There is some callus formation.  No further injuries.  Shoulder is reduced.  Minimal degenerative changes.  Impression: Stable right clavicle shaft fracture  New Medications:  No orders of the defined types were placed in this encounter.     Oliver Barre, MD  08/23/2023 12:48 PM

## 2023-08-23 NOTE — ED Provider Notes (Signed)
David Herrera, David Herrera   CSN: [id number] Arrival date & time: 08/23/23  1833     History  Chief Complaint  Patient presents with   unable to walk for past week    David Herrera is a 89 y.o. male.  HPI Patient presents with generalized weakness.  Also reportedly weak in his legs.  Has had some confusion.  Really has been unable to walk.  Walks with a cane at baseline.  Some swelling in his legs but states that is improving.  Seen by Ortho today for recent clavicle fracture but was cleared.  Was unable to get from the chair to the bed and had to be lifted up.  No back pain.  Reportedly has had good appetite.   Past Medical History:  Diagnosis Date   Basal cell carcinoma 06/14/2003   left flank   Basal cell carcinoma 06/14/2003   right upper back   Basal cell carcinoma 06/14/2003   left arm   Basal cell carcinoma 01/13/2017   left back of neck/hairline   Basal cell carcinoma 06/02/2015   right leg   Basal cell carcinoma 04/08/2018   left chest   Diabetes mellitus without complication (HCC)    DJD (degenerative joint disease) of hip    right   HOH (hard of hearing)    Hypertension    Melanoma (HCC) 06/14/2003   let outer back   Rosacea    Rosacea    Vertigo     Home Medications Prior to Admission medications   Medication Sig Start Date End Date Taking? Authorizing Provider  acetaminophen (TYLENOL) 500 MG tablet Take 500 mg by mouth 2 (two) times daily as needed for mild pain.    [provider]  aspirin EC 81 MG tablet Take 81 mg by mouth daily.    [provider]  Cholecalciferol (VITAMIN D) 2000 UNITS CAPS Take 2,000 Units by mouth 2 (two) times daily.    [provider]  co-enzyme Q-10 30 MG capsule Take 30 mg by mouth daily.    [provider]  hydrochlorothiazide (HYDRODIURIL) 25 MG tablet Take 25 mg by mouth daily.    [provider]  losartan (COZAAR) 25 MG tablet  Take 25 mg by mouth daily. 04/07/21   [provider]  meclizine (ANTIVERT) 25 MG tablet Take 1 tablet (25 mg total) by mouth 3 (three) times daily as needed for dizziness. 11/14/16   Bethann Berkshire, MD  metFORMIN (GLUCOPHAGE) 500 MG tablet 1 tablet daily. 10/29/16   [provider]  simvastatin (ZOCOR) 20 MG tablet Take 10 mg by mouth every evening.     [provider]      Allergies    Patient has no known allergies.    Review of Systems   Review of Systems  Physical Exam Updated Vital Signs BP (!) 140/75   Pulse 76   Temp (!) 97.5 F (36.4 C)   Resp 15   SpO2 95%  Physical Exam Vitals and nursing Herrera reviewed.  HENT:     Head: Normocephalic.  Eyes:     Pupils: Pupils are equal, round, and reactive to light.  Cardiovascular:     Rate and Rhythm: Regular rhythm.  Chest:     Chest wall: No tenderness.  Abdominal:     Tenderness: There is no abdominal tenderness.  Musculoskeletal:     Right lower leg: Edema present.     Left lower leg: Edema  present.     Comments: Pitting edema bilateral lower extremities.  Skin:    General: Skin is warm.  Neurological:     Mental Status: He is alert and oriented to person, place, and time.     Comments: Good grip strength bilateral upper extremities.  May be more weak on lower extremities but is able to raise each independently but not as much strength as with upper extremities.     ED Results / Procedures / Treatments   Labs (all labs ordered are listed, but only abnormal results are displayed) Labs Reviewed  URINALYSIS, ROUTINE W REFLEX MICROSCOPIC - Abnormal; Notable for the following components:      Result Value   Hgb urine dipstick SMALL (*)    All other components within normal limits  COMPREHENSIVE METABOLIC PANEL - Abnormal; Notable for the following components:   Sodium 131 (*)    Potassium 3.3 (*)    Chloride 96 (*)    Glucose, Bld 177 (*)    Calcium 8.6 (*)    Total Protein 6.4 (*)     All other components within normal limits  CBC WITH DIFFERENTIAL/PLATELET - Abnormal; Notable for the following components:   RBC 3.91 (*)    Hemoglobin 12.0 (*)    HCT 35.8 (*)    All other components within normal limits  BRAIN NATRIURETIC PEPTIDE  TSH    EKG None  Radiology DG Chest Portable 1 View  Result Date: 08/23/2023 CLINICAL DATA:  Weakness EXAM: PORTABLE CHEST 1 VIEW COMPARISON:  08/25/2013 FINDINGS: Ununited displaced right mid clavicular fracture. No acute airspace disease. Stable cardiomediastinal silhouette with aortic atherosclerosis. Old appearing deformity of the proximal left humerus. IMPRESSION: 1. No active disease. Electronically Signed   By: Jasmine Pang M.D.   On: 08/23/2023 19:49    Procedures Procedures    Medications Ordered in ED Medications - No data to display  ED Course/ Medical Decision Making/ A&P                                 Medical Decision Making Amount and/or Complexity of Data Reviewed Labs: ordered. Radiology: ordered.   Patient with generalized weakness for particular lower extremities.  Differential diagnosis along with includes infection, neurologic issues, dehydration.  Will get blood work.  Will get urinalysis.  Reviewed Ortho Herrera from today.  Blood work shows mild hyponatremia.  Mild anemia.  Normal creatinine.  Chest x-ray reassuring.  Urinalysis does not show infection.  Chest x-ray reassuring.  Will add head CT on although I think less likely because from the brain with both lower extremities involved.  The fact that he is worsened and could not ambulate on his own and had to get carried to the bed will admit for further workup and potentially physical therapy.        Final Clinical Impression(s) / ED Diagnoses Final diagnoses:  Weakness    Rx / DC Orders ED Discharge Orders     None         Benjiman Core, MD 08/23/23 2308

## 2023-08-23 NOTE — ED Triage Notes (Signed)
Pt's son states pt has not been able to walk for the past week. Son states pt with new confusion and is concerned that pt may have UTI or has had a stroke.  No facial droop or one sided weakness noted in triage.

## 2023-08-23 NOTE — ED Notes (Signed)
Pt attempted to use urinal, unable to provide sample at this time.

## 2023-08-24 DIAGNOSIS — Z79899 Other long term (current) drug therapy: Secondary | ICD-10-CM | POA: Diagnosis not present

## 2023-08-24 DIAGNOSIS — E860 Dehydration: Secondary | ICD-10-CM | POA: Insufficient documentation

## 2023-08-24 DIAGNOSIS — E871 Hypo-osmolality and hyponatremia: Secondary | ICD-10-CM | POA: Insufficient documentation

## 2023-08-24 DIAGNOSIS — D638 Anemia in other chronic diseases classified elsewhere: Secondary | ICD-10-CM | POA: Insufficient documentation

## 2023-08-24 DIAGNOSIS — Z7982 Long term (current) use of aspirin: Secondary | ICD-10-CM | POA: Diagnosis not present

## 2023-08-24 DIAGNOSIS — E876 Hypokalemia: Secondary | ICD-10-CM | POA: Diagnosis not present

## 2023-08-24 DIAGNOSIS — E440 Moderate protein-calorie malnutrition: Secondary | ICD-10-CM | POA: Diagnosis not present

## 2023-08-24 DIAGNOSIS — R531 Weakness: Secondary | ICD-10-CM | POA: Diagnosis not present

## 2023-08-24 DIAGNOSIS — E1165 Type 2 diabetes mellitus with hyperglycemia: Secondary | ICD-10-CM | POA: Diagnosis not present

## 2023-08-24 DIAGNOSIS — Z7984 Long term (current) use of oral hypoglycemic drugs: Secondary | ICD-10-CM | POA: Diagnosis not present

## 2023-08-24 DIAGNOSIS — E782 Mixed hyperlipidemia: Secondary | ICD-10-CM | POA: Diagnosis not present

## 2023-08-24 DIAGNOSIS — Z96641 Presence of right artificial hip joint: Secondary | ICD-10-CM | POA: Diagnosis not present

## 2023-08-24 DIAGNOSIS — I10 Essential (primary) hypertension: Secondary | ICD-10-CM | POA: Diagnosis not present

## 2023-08-24 DIAGNOSIS — R0602 Shortness of breath: Secondary | ICD-10-CM | POA: Diagnosis not present

## 2023-08-24 DIAGNOSIS — L8990 Pressure ulcer of unspecified site, unspecified stage: Secondary | ICD-10-CM | POA: Insufficient documentation

## 2023-08-24 DIAGNOSIS — Z85828 Personal history of other malignant neoplasm of skin: Secondary | ICD-10-CM | POA: Diagnosis not present

## 2023-08-24 DIAGNOSIS — E538 Deficiency of other specified B group vitamins: Secondary | ICD-10-CM | POA: Diagnosis not present

## 2023-08-24 LAB — GLUCOSE, CAPILLARY
Glucose-Capillary: 127 mg/dL — ABNORMAL HIGH (ref 70–99)
Glucose-Capillary: 141 mg/dL — ABNORMAL HIGH (ref 70–99)
Glucose-Capillary: 152 mg/dL — ABNORMAL HIGH (ref 70–99)
Glucose-Capillary: 116 mg/dL — ABNORMAL HIGH (ref 70–99)

## 2023-08-24 LAB — SODIUM, URINE, RANDOM: Sodium, Ur: 71 mmol/L

## 2023-08-24 LAB — COMPREHENSIVE METABOLIC PANEL
ALT: 16 U/L (ref 0–44)
AST: 16 U/L (ref 15–41)
Albumin: 3 g/dL — ABNORMAL LOW (ref 3.5–5.0)
Alkaline Phosphatase: 70 U/L (ref 38–126)
Anion gap: 6 (ref 5–15)
BUN: 12 mg/dL (ref 8–23)
CO2: 25 mmol/L (ref 22–32)
Calcium: 8.4 mg/dL — ABNORMAL LOW (ref 8.9–10.3)
Chloride: 104 mmol/L (ref 98–111)
Creatinine, Ser: 0.61 mg/dL (ref 0.61–1.24)
GFR, Estimated: >60 mL/min (ref >60)
Glucose, Bld: 133 mg/dL — ABNORMAL HIGH (ref 70–99)
Potassium: 3.4 mmol/L — ABNORMAL LOW (ref 3.5–5.1)
Sodium: 135 mmol/L (ref 135–145)
Total Bilirubin: 0.7 mg/dL (ref 0.3–1.2)
Total Protein: 5.7 g/dL — ABNORMAL LOW (ref 6.5–8.1)

## 2023-08-24 LAB — HEMOGLOBIN A1C
Hgb A1c MFr Bld: 6.9 % — ABNORMAL HIGH (ref 4.8–5.6)
Mean Plasma Glucose: 151.33 mg/dL

## 2023-08-24 LAB — CBC
HCT: 33.9 % — ABNORMAL LOW (ref 39.0–52.0)
Hemoglobin: 11.3 g/dL — ABNORMAL LOW (ref 13.0–17.0)
MCH: 29.9 pg (ref 26.0–34.0)
MCHC: 33.3 g/dL (ref 30.0–36.0)
MCV: 89.7 fL (ref 80.0–100.0)
Platelets: 202 10*3/uL (ref 150–400)
RBC: 3.78 MIL/uL — ABNORMAL LOW (ref 4.22–5.81)
RDW: 13 % (ref 11.5–15.5)
WBC: 5.1 10*3/uL (ref 4.0–10.5)
nRBC: 0 % (ref 0.0–0.2)

## 2023-08-24 LAB — MAGNESIUM: Magnesium: 2.2 mg/dL (ref 1.7–2.4)

## 2023-08-24 LAB — OSMOLALITY, URINE: Osmolality, Ur: 400 mOsm/kg (ref 300–900)

## 2023-08-24 LAB — VITAMIN B12: Vitamin B-12: 102 pg/mL — ABNORMAL LOW (ref 180–914)

## 2023-08-24 LAB — OSMOLALITY: Osmolality: 295 mOsm/kg (ref 275–295)

## 2023-08-24 LAB — PHOSPHORUS: Phosphorus: 2.8 mg/dL (ref 2.5–4.6)

## 2023-08-24 LAB — FOLATE: Folate: 13.8 ng/mL (ref >5.9)

## 2023-08-24 MED ORDER — INSULIN ASPART 100 UNIT/ML IJ SOLN
0.0000 [IU] | Freq: Three times a day (TID) | INTRAMUSCULAR | Status: DC
  Administered 2023-08-24: 2 [IU] via SUBCUTANEOUS
  Administered 2023-08-24: 3 [IU] via SUBCUTANEOUS
  Administered 2023-08-24 – 2023-08-26 (×3): 2 [IU] via SUBCUTANEOUS
  Administered 2023-08-26: 3 [IU] via SUBCUTANEOUS
  Administered 2023-08-27: 2 [IU] via SUBCUTANEOUS
  Administered 2023-08-27: 3 [IU] via SUBCUTANEOUS
  Administered 2023-08-27: 2 [IU] via SUBCUTANEOUS
  Administered 2023-08-28: 3 [IU] via SUBCUTANEOUS
  Administered 2023-08-28 – 2023-08-30 (×5): 2 [IU] via SUBCUTANEOUS
  Administered 2023-08-31 – 2023-09-01 (×2): 3 [IU] via SUBCUTANEOUS
  Administered 2023-09-01 – 2023-09-02 (×2): 2 [IU] via SUBCUTANEOUS
  Administered 2023-09-03: 3 [IU] via SUBCUTANEOUS

## 2023-08-24 MED ORDER — SODIUM CHLORIDE 0.9 % IV SOLN: INTRAVENOUS | Status: AC

## 2023-08-24 MED ORDER — POTASSIUM CHLORIDE CRYS ER 20 MEQ PO TBCR
40.0000 meq | EXTENDED_RELEASE_TABLET | Freq: Once | ORAL | Status: AC
  Administered 2023-08-24: 40 meq via ORAL
  Filled 2023-08-24: qty 2

## 2023-08-24 MED ORDER — VITAMIN B-12 1000 MCG PO TABS: 1000.0000 ug | ORAL_TABLET | Freq: Every day | ORAL | 1 refills | Status: DC

## 2023-08-24 MED ORDER — LOSARTAN POTASSIUM 50 MG PO TABS
25.0000 mg | ORAL_TABLET | Freq: Every day | ORAL | Status: DC
  Administered 2023-08-24 – 2023-09-03 (×11): 25 mg via ORAL
  Filled 2023-08-24 (×11): qty 1

## 2023-08-24 MED ORDER — ZINC OXIDE 40 % EX OINT
TOPICAL_OINTMENT | Freq: Two times a day (BID) | CUTANEOUS | Status: DC
  Filled 2023-08-24 (×2): qty 57

## 2023-08-24 MED ORDER — MAGNESIUM SULFATE 2 GM/50ML IV SOLN
2.0000 g | Freq: Once | INTRAVENOUS | Status: AC
  Administered 2023-08-24: 2 g via INTRAVENOUS
  Filled 2023-08-24: qty 50

## 2023-08-24 MED ORDER — ENOXAPARIN SODIUM 40 MG/0.4ML IJ SOSY
40.0000 mg | PREFILLED_SYRINGE | INTRAMUSCULAR | Status: DC
  Administered 2023-08-24 – 2023-09-03 (×11): 40 mg via SUBCUTANEOUS
  Filled 2023-08-24 (×11): qty 0.4

## 2023-08-24 MED ORDER — SIMVASTATIN 20 MG PO TABS
10.0000 mg | ORAL_TABLET | Freq: Every evening | ORAL | Status: DC
  Administered 2023-08-24 – 2023-09-02 (×10): 10 mg via ORAL
  Filled 2023-08-24 (×10): qty 1

## 2023-08-24 NOTE — Care Management CC44 (Signed)
Condition Code 44 Documentation Completed  Patient Details  Name: SHIQUAN RAPISARDA MRN: [id number] Date of Birth: 1933-05-23   Condition Code 44 given:  Yes Patient signature on Condition Code 44 notice:  Yes  Documentation of 2 MD's agreement:  Yes  Code 44 added to claim:  Yes     Cythnia Osmun Marsh Dolly, LCSW 08/24/2023, 10:58 AM

## 2023-08-24 NOTE — Progress Notes (Signed)
Assisted patient with using the urinal. Patient had a lot of difficulty standing and balancing. Assisted back to bed. Bed alarm on. Plan of care ongoing.

## 2023-08-24 NOTE — Discharge Summary (Signed)
Physician Discharge Summary  David Herrera XBJ:[id number] DOB: 08/02/1933  PCP: Georgann Housekeeper, MD  Admitted from: Home Discharged to: Home  Admit date: 08/23/2023 Discharge date: 08/24/2023  Recommendations for Outpatient Follow-up:    Follow-up Information     Georgann Housekeeper, MD. Schedule an appointment as soon as possible for a visit in 1 week(s).   Specialty: Internal Medicine Why: To be seen with repeat labs (CBC & BMP). Contact information: [address] [place] [address] [phone]                  Home Health: Home Health Orders (From admission, onward)     Start     Ordered   08/24/23 1036  Home Health  At discharge       Question:  To provide the following care/treatments  Answer:  PT   08/24/23 1038             Equipment/Devices: None    Discharge Condition: Improved and stable   Code Status: Full Code Diet recommendation:  Discharge Diet Orders (From admission, onward)     Start     Ordered   08/24/23 0000  Diet - low sodium heart healthy        08/24/23 1038   08/24/23 0000  Diet Carb Modified        08/24/23 1038             Discharge Diagnoses:  Principal Problem:   Generalized weakness Active Problems:   Essential hypertension   Weakness of right leg   Dehydration   Anemia of chronic disease   Hypokalemia   Hyponatremia   Type 2 diabetes mellitus with hyperglycemia (HCC)   Mixed hyperlipidemia   Brief Summary: 89 year old male, reports that he lives alone, ambulates with the help of a cane, states that his son lives within a mile away, PMH of HTN, HLD, DM 2, presented to the ED due to generalized weakness that has been ongoing for the last 2 weeks and since then had progressed to inability to walk.  He sustained a right clavicle fracture about 6 weeks prior, followed up with his orthopedic surgeon on day of ED visit and stable right clavicle shaft fracture was noted.  He was advised to gradually  increase activity.  ED Course:  In the emergency department, BP was 115/57, other vital signs were within normal range.  Workup in the ED showed normocytic anemia, BMP showed sodium 131, potassium 3.3, chloride 96, bicarb 23, blood glucose 177, BUN 19, creatinine 0.74.  BNP 80, TSH 1.648, urinalysis was normal, magnesium 1.7 CT of head showed no acute intracranial abnormality Chest x-ray showed no active disease.  Assessment and plan/Hospital course:  He was admitted for generalized weakness which probably is multifactorial related to very advanced age, mild hypokalemia and may be an element of mild dehydration.  He was briefly hydrated with IV fluids.  Hypokalemia was repleted prior to discharge and can be followed up as outpatient.  PT evaluated, he ambulated 70 feet with PT and they have recommended home health PT without DME.  OT evaluation is pending.  He has mild anemia which can be followed as outpatient, no overt bleeding.  Magnesium and phosphorus are normal.  Continue prior meds for his DM, HTN and hyperlipidemia.  Vitamin B-12 levels were checked and low, initiated oral vitamin B 12 supplements, recommend checking levels in a couple of months and if still low then may need further evaluation and  consideration for parenteral B12 supplements.   Consultations: None  Procedures: None   Discharge Instructions  Discharge Instructions     Call MD for:  difficulty breathing, headache or visual disturbances   Complete by: As directed    Call MD for:  extreme fatigue   Complete by: As directed    Call MD for:  persistant dizziness or light-headedness   Complete by: As directed    Call MD for:  persistant nausea and vomiting   Complete by: As directed    Call MD for:  redness, tenderness, or signs of infection (pain, swelling, redness, odor or green/yellow discharge around incision site)   Complete by: As directed    Call MD for:  severe uncontrolled pain   Complete by: As directed     Call MD for:  temperature >100.4   Complete by: As directed    Diet - low sodium heart healthy   Complete by: As directed    Diet Carb Modified   Complete by: As directed    Increase activity slowly   Complete by: As directed    No wound care   Complete by: As directed         Medication List     TAKE these medications    acetaminophen 500 MG tablet Commonly known as: TYLENOL Take 500 mg by mouth 2 (two) times daily as needed for mild pain.   aspirin EC 81 MG tablet Take 81 mg by mouth daily.   co-enzyme Q-10 30 MG capsule Take 30 mg by mouth daily.   cyanocobalamin 1000 MCG tablet Commonly known as: VITAMIN B12 Take 1 tablet (1,000 mcg total) by mouth daily.   hydrochlorothiazide 25 MG tablet Commonly known as: HYDRODIURIL Take 25 mg by mouth daily.   losartan 25 MG tablet Commonly known as: COZAAR Take 25 mg by mouth daily.   meclizine 25 MG tablet Commonly known as: ANTIVERT Take 1 tablet (25 mg total) by mouth 3 (three) times daily as needed for dizziness.   metFORMIN 500 MG tablet Commonly known as: GLUCOPHAGE 1 tablet daily.   simvastatin 20 MG tablet Commonly known as: ZOCOR Take 10 mg by mouth every evening.   Vitamin D 50 MCG (2000 UT) Caps Take 2,000 Units by mouth 2 (two) times daily.       No Known Allergies    Procedures/Studies: CT Head Wo Contrast  Result Date: 08/23/2023 CLINICAL DATA:  Neuro deficit, acute, stroke suspected EXAM: CT HEAD WITHOUT CONTRAST TECHNIQUE: Contiguous axial images were obtained from the base of the skull through the vertex without intravenous contrast. RADIATION DOSE REDUCTION: This exam was performed according to the departmental dose-optimization program which includes automated exposure control, adjustment of the mA and/or kV according to patient size and/or use of iterative reconstruction technique. COMPARISON:  11/14/2016 FINDINGS: Brain: There is atrophy and chronic small vessel disease changes. No  acute intracranial abnormality. Specifically, no hemorrhage, hydrocephalus, mass lesion, acute infarction, or significant intracranial injury. Vascular: No hyperdense vessel or unexpected calcification. Skull: No acute calvarial abnormality. Sinuses/Orbits: Inspissated mucus in the left frontal sinus with calcifications, unchanged. No air-fluid levels. Mastoid air cells clear. Other: None IMPRESSION: Atrophy, chronic microvascular disease. No acute intracranial abnormality. Electronically Signed   By: Charlett Nose M.D.   On: 08/23/2023 23:29   DG Shoulder Right  Result Date: 08/23/2023 X-rays of the right shoulder were obtained in clinic today.  These are compared to previous x-rays.  Fracture of the mid clavicle shaft remains  in stable position.  There is some callus formation.  No further injuries.  Shoulder is reduced.  Minimal degenerative changes.  Impression: Stable right clavicle shaft fracture   DG Chest Portable 1 View  Result Date: 08/23/2023 CLINICAL DATA:  Weakness EXAM: PORTABLE CHEST 1 VIEW COMPARISON:  08/25/2013 FINDINGS: Ununited displaced right mid clavicular fracture. No acute airspace disease. Stable cardiomediastinal silhouette with aortic atherosclerosis. Old appearing deformity of the proximal left humerus. IMPRESSION: 1. No active disease. Electronically Signed   By: Jasmine Pang M.D.   On: 08/23/2023 19:49   DG Clavicle Right  Result Date: 08/14/2023 X-rays of the right clavicle were obtained in clinic today.  These are compared to prior x-rays.  There is a fracture of the mid right clavicle shaft.  No interval displacement.  There is some interval callus formation.  No additional injuries are noted.  Glenohumeral joint is reduced.  Impression: Healing right midshaft clavicle fracture without further displacement.      Subjective: Patient denies complaints.  Reports that he feels stronger.  Just finished ambulation with PT.  No other complaints reported.  As per RN, no  acute issues noted.  Discharge Exam:  Vitals:   08/24/23 0014 08/24/23 0050 08/24/23 0501 08/24/23 0752  BP: 103/77 (!) 147/72 116/63 127/79  Pulse: 81 84 77 76  Resp: 19 17 18 18   Temp: 97.6 F (36.4 C) 98 F (36.7 C) 98 F (36.7 C) 98.1 F (36.7 C)  TempSrc: Oral Oral  Oral  SpO2: 99% 99% 98% 97%  Weight: 71.3 kg       General: Elderly male, moderately built and thinly nourished, sitting up comfortably in reclining chair working on his breakfast. Cardiovascular: S1 & S2 heard, RRR, S1/S2 +. No murmurs, rubs, gallops or clicks. No JVD.  1+ pitting bilateral leg edema Respiratory: Clear to auscultation without wheezing, rhonchi or crackles. No increased work of breathing. Abdominal:  Non distended, non tender & soft. No organomegaly or masses appreciated. Normal bowel sounds heard. CNS: Alert and oriented. No focal deficits. Extremities: no edema, no cyanosis    The results of significant diagnostics from this hospitalization (including imaging, microbiology, ancillary and laboratory) are listed below for reference.     Microbiology: No results found for this or any previous visit (from the past 240 hour(s)).   Labs: CBC: Recent Labs  Lab 08/23/23 1951 08/24/23 0537  WBC 5.5 5.1  NEUTROABS 3.8  --   HGB 12.0* 11.3*  HCT 35.8* 33.9*  MCV 91.6 89.7  PLT 207 202    Basic Metabolic Panel: Recent Labs  Lab 08/23/23 1951 08/24/23 0537  NA 131* 135  K 3.3* 3.4*  CL 96* 104  CO2 23 25  GLUCOSE 177* 133*  BUN 19 12  CREATININE 0.74 0.61  CALCIUM 8.6* 8.4*  MG 1.7 2.2  PHOS  --  2.8    Liver Function Tests: Recent Labs  Lab 08/23/23 1951 08/24/23 0537  AST 18 16  ALT 18 16  ALKPHOS 92 70  BILITOT 0.6 0.7  PROT 6.4* 5.7*  ALBUMIN 3.5 3.0*    CBG: Recent Labs  Lab 08/24/23 0746  GLUCAP 127*     Thyroid function studies Recent Labs    08/23/23 1951  TSH 1.648    Anemia work up Recent Labs    08/24/23 0537  VITAMINB12 102*  FOLATE  13.8    Urinalysis    Component Value Date/Time   COLORURINE YELLOW 08/23/2023 1951   APPEARANCEUR CLEAR 08/23/2023  1951   LABSPEC 1.010 08/23/2023 1951   PHURINE 6.0 08/23/2023 1951   GLUCOSEU NEGATIVE 08/23/2023 1951   HGBUR SMALL (A) 08/23/2023 1951   BILIRUBINUR NEGATIVE 08/23/2023 1951   KETONESUR NEGATIVE 08/23/2023 1951   PROTEINUR NEGATIVE 08/23/2023 1951   UROBILINOGEN 0.2 08/25/2013 1044   NITRITE NEGATIVE 08/23/2023 1951   LEUKOCYTESUR NEGATIVE 08/23/2023 1951    Unsuccessfully attempted to reach patient's son via phone, left a voicemail message.  Time coordinating discharge: 25 minutes  SIGNED:  Marcellus Scott, MD,  FACP, [facility], [facility], [facility], [facility]   [facility] [place]      To contact the attending provider between 7A-7P or the covering provider during after hours 7P-7A, please log into the web site [email or web address] and access using universal [place] password for that web site. If you do not have the password, please call the hospital operator.

## 2023-08-24 NOTE — Plan of Care (Signed)
  Problem: Acute Rehab PT Goals(only PT should resolve) Goal: Pt Will Go Supine/Side To Sit Flowsheets (Taken 08/24/2023 0934) Pt will go Supine/Side to Sit: with modified independence Goal: Pt Will Ambulate Flowsheets (Taken 08/24/2023 0934) Pt will Ambulate:  > 125 feet  with modified independence  with least restrictive assistive device

## 2023-08-24 NOTE — [place] FL2 (Signed)
   MEDICAID FL2 LEVEL OF CARE FORM     IDENTIFICATION  Patient Name: David Herrera Birthdate: 02/15/1933 Sex: male Admission Date (Current Location): 08/23/2023  [facility] and [place] Number:  [employer] and Address:  [facility],  [address] [place] [address]      Provider Number:    Attending Physician Name and Address:  Elease Etienne, MD  Relative Name and Phone Number:  Antoney, Belew [phone])  [phone] [facility])    Current Level of Care: Hospital Recommended Level of Care: Skilled Nursing Facility Prior Approval Number:    Date Approved/Denied:   PASRR Number: [id number]  Discharge Plan: SNF    Current Diagnoses: Patient Active Problem List   Diagnosis Date Noted   Dehydration 08/24/2023   Pressure injury of skin 08/24/2023   Anemia of chronic disease 08/24/2023   Hypokalemia 08/24/2023   Hyponatremia 08/24/2023   Type 2 diabetes mellitus with hyperglycemia (HCC) 08/24/2023   Mixed hyperlipidemia 08/24/2023   Generalized weakness 08/23/2023   Difficulty in walking(719.7) 09/22/2013   Unstable balance 09/22/2013   Weakness of right leg 09/22/2013   Essential hypertension    DJD (degenerative joint disease) of hip     Orientation RESPIRATION BLADDER Height & Weight     Self, Situation, Place  Normal Continent Weight: 157 lb 3 oz (71.3 kg) Height:     BEHAVIORAL SYMPTOMS/MOOD NEUROLOGICAL BOWEL NUTRITION STATUS      Continent Diet  AMBULATORY STATUS COMMUNICATION OF NEEDS Skin   Limited Assist Verbally Other (Comment) (Dry skin)                       Personal Care Assistance Level of Assistance  Bathing, Feeding, Dressing Bathing Assistance: Limited assistance Feeding assistance: Limited assistance Dressing Assistance: Limited assistance     Functional Limitations Info  Hearing   Hearing Info: Impaired      SPECIAL CARE FACTORS FREQUENCY                       Contractures  Contractures Info: Not present    Additional Factors Info  Code Status, Allergies Code Status Info: Full Allergies Info: NKA           Current Medications (08/24/2023):  This is the current hospital active medication list Current Facility-Administered Medications  Medication Dose Route Frequency Provider Last Rate Last Admin   0.9 %  sodium chloride infusion   Intravenous Continuous Adefeso, Oladapo, DO   Stopped at 08/24/23 1134   enoxaparin (LOVENOX) injection 40 mg  40 mg Subcutaneous Q24H Adefeso, Oladapo, DO   40 mg at 08/24/23 1000   insulin aspart (novoLOG) injection 0-15 Units  0-15 Units Subcutaneous TID WC Adefeso, Oladapo, DO   2 Units at 08/24/23 1130   liver oil-zinc oxide (DESITIN) 40 % ointment   Topical BID Adefeso, Oladapo, DO   Given at 08/24/23 1042   losartan (COZAAR) tablet 25 mg  25 mg Oral Daily Adefeso, Oladapo, DO   25 mg at 08/24/23 0957   simvastatin (ZOCOR) tablet 10 mg  10 mg Oral QPM Adefeso, Oladapo, DO         Discharge Medications: Please see discharge summary for a list of discharge medications.  Relevant Imaging Results:  Relevant Lab Results:   Additional Information [id number]  Catalina Gravel, LCSW

## 2023-08-24 NOTE — Discharge Instructions (Signed)

## 2023-08-24 NOTE — Care Management Obs Status (Signed)
MEDICARE OBSERVATION STATUS NOTIFICATION   Patient Details  Name: David Herrera MRN: [id number] Date of Birth: Oct 31, 1933   Medicare Observation Status Notification Given:  Yes    [name], LCSW 08/24/2023, 10:57 AM

## 2023-08-24 NOTE — Evaluation (Signed)
Physical Therapy Evaluation Patient Details Name: David Herrera MRN: [id number] DOB: 1933/08/16 Today's Date: 08/24/2023  History of Present Illness   Per WU:[id number] David Herrera is an 89 y.o. male with medical history significant of hypertension, hyperlipidemia, T2DM who presents to the emergency department due to generalized weakness that has been ongoing since last 2 weeks which has since progressed to inability to walk.  He uses a cane at baseline.  Patient sustained right clavicle fracture about 6 weeks ago, he followed up with his orthopedic surgeon today and stable right clavicle shaft fracture was noted.  He was advised to gradually increase activity.   Clinical Impression  PT is not stable walking with a cane, however he is stable with a rolling walker but not reaching out of his base of support.  Recommend HH to improve stability of walking       If plan is discharge home, recommend the following: A little help with walking and/or transfers;Assistance with cooking/housework;Assist for transportation   Can travel by private vehicle    yes    Equipment Recommendations None recommended by PT  Recommendations for Other Services  OT consult    Functional Status Assessment Patient has had a recent decline in their functional status and demonstrates the ability to make significant improvements in function in a reasonable and predictable amount of time.     Precautions / Restrictions Precautions Precautions: Fall Precaution Comments: PT stable with RW, not stable with cane. Restrictions Weight Bearing Restrictions: No      Mobility  Bed Mobility Overal bed mobility: Modified Independent             General bed mobility comments: multiple attempts to come sit to stand from bed.  Able to come sit to stand from chair    Transfers Overall transfer level: Modified independent Equipment used: Rolling walker (2 wheels)                     Ambulation/Gait Ambulation/Gait assistance: Modified independent (Device/Increase time) Gait Distance (Feet): 70 Feet Assistive device: Rolling walker (2 wheels) Gait Pattern/deviations: Decreased step length - left, Decreased step length - right   Gait velocity interpretation: <1.8 ft/sec, indicate of risk for recurrent falls   General Gait Details: Pt states he could walk a mile but wants to eat his breakfast.       Pertinent Vitals/Pain Pain Assessment Pain Assessment: No/denies pain    Home Living Family/patient expects to be discharged to:: Private residence Living Arrangements: Children Available Help at Discharge: Available PRN/intermittently;Family Type of Home: House Home Access: Ramped entrance       Home Layout: One level Home Equipment: Cane - single Librarian, academic (2 wheels)      Prior Function Prior Level of Function : Independent/Modified Independent                     Extremity/Trunk Assessment        Lower Extremity Assessment Lower Extremity Assessment: Overall WFL for tasks assessed       Communication   Communication Communication: No apparent difficulties Cueing Techniques: Verbal cues  Cognition Arousal: Alert   Overall Cognitive Status: Within Functional Limits for tasks assessed                                                 Assessment/Plan  PT Assessment Patient needs continued PT services  PT Problem List Decreased strength       PT Treatment Interventions Therapeutic exercise;Gait training    PT Goals (Current goals can be found in the Care Plan section)       Frequency Min 2X/week        AM-PAC PT "6 Clicks" Mobility  Outcome Measure Help needed turning from your back to your side while in a flat bed without using bedrails?: None Help needed moving from lying on your back to sitting on the side of a flat bed without using bedrails?: A Little Help needed moving to and from a  bed to a chair (including a wheelchair)?: A Little Help needed standing up from a chair using your arms (e.g., wheelchair or bedside chair)?: A Little Help needed to walk in hospital room?: A Little Help needed climbing 3-5 steps with a railing? : A Little 6 Click Score: 19    End of Session Equipment Utilized During Treatment: Gait belt Activity Tolerance: Patient tolerated treatment well Patient left: in chair;with call bell/phone within reach Nurse Communication: Mobility status PT Visit Diagnosis: Unsteadiness on feet (R26.81);Muscle weakness (generalized) (M62.81)    Time: 1610-9604 PT Time Calculation (min) (ACUTE ONLY): 30 min   Charges:   PT Evaluation $PT Eval Moderate Complexity: 1 Mod PT Treatments $Gait Training: 8-22 mins PT General Charges $$ ACUTE PT VISIT: 1 Visit       David Herrera, PT CLT [phone]  08/24/2023, 9:35 AM

## 2023-08-24 NOTE — TOC Transition Note (Addendum)
Transition of Care [facility]) - CM/SW Discharge Note   Patient Details  Name: David Herrera MRN: [id number] Date of Birth: 1933-10-19  Transition of Care (TOC) CM/SW Contact:  Catalina Gravel, LCSW Phone Number: 08/24/2023, 11:17 AM   Clinical Narrative:    Pt clear for DC today, was admitted.  Admission became Observation C$$ delivered at bedside.  Pt son present and requested to speak with someone prior to DC, CSW sent secure chat to MD and nursing staff advising that C44 delivered, family would like to ask questions. No further TOC needs.   Addendum: CSW followed up with pt and son at MD request.  Son not confident pt can return home without SNF therapy as HHPT has not worked in the past.Pt agreed he would like SNF to get rehab. PASSR completed and FL2 sent for bed offers. Needs Auth if bed selected. Son first choice [facility] or a place close to [place].  CV last choice. Son inquired about [facility], but is aware they do not have openings.  TOC to follow.   Barriers to Discharge: Continued Medical Work up   Patient Goals and CMS Choice      Discharge Placement                         Discharge Plan and Services Additional resources added to the After Visit Summary for                                       Social Determinants of Health (SDOH) Interventions SDOH Screenings   Food Insecurity: No Food Insecurity (08/24/2023)  Housing: Low Risk  (08/24/2023)  Transportation Needs: No Transportation Needs (08/24/2023)  Utilities: Not At Risk (08/24/2023)  Tobacco Use: Low Risk  (08/23/2023)     Readmission Risk Interventions     No data to display

## 2023-08-25 DIAGNOSIS — E440 Moderate protein-calorie malnutrition: Secondary | ICD-10-CM | POA: Diagnosis not present

## 2023-08-25 DIAGNOSIS — E538 Deficiency of other specified B group vitamins: Secondary | ICD-10-CM | POA: Diagnosis not present

## 2023-08-25 DIAGNOSIS — Z7984 Long term (current) use of oral hypoglycemic drugs: Secondary | ICD-10-CM | POA: Diagnosis not present

## 2023-08-25 DIAGNOSIS — Z96641 Presence of right artificial hip joint: Secondary | ICD-10-CM | POA: Diagnosis not present

## 2023-08-25 DIAGNOSIS — I10 Essential (primary) hypertension: Secondary | ICD-10-CM | POA: Diagnosis not present

## 2023-08-25 DIAGNOSIS — E876 Hypokalemia: Secondary | ICD-10-CM | POA: Diagnosis not present

## 2023-08-25 DIAGNOSIS — E871 Hypo-osmolality and hyponatremia: Secondary | ICD-10-CM | POA: Diagnosis not present

## 2023-08-25 DIAGNOSIS — E782 Mixed hyperlipidemia: Secondary | ICD-10-CM | POA: Diagnosis not present

## 2023-08-25 DIAGNOSIS — R0602 Shortness of breath: Secondary | ICD-10-CM | POA: Diagnosis not present

## 2023-08-25 DIAGNOSIS — Z79899 Other long term (current) drug therapy: Secondary | ICD-10-CM | POA: Diagnosis not present

## 2023-08-25 DIAGNOSIS — Z85828 Personal history of other malignant neoplasm of skin: Secondary | ICD-10-CM | POA: Diagnosis not present

## 2023-08-25 DIAGNOSIS — Z7982 Long term (current) use of aspirin: Secondary | ICD-10-CM | POA: Diagnosis not present

## 2023-08-25 DIAGNOSIS — L8990 Pressure ulcer of unspecified site, unspecified stage: Secondary | ICD-10-CM | POA: Diagnosis not present

## 2023-08-25 DIAGNOSIS — E860 Dehydration: Secondary | ICD-10-CM | POA: Diagnosis not present

## 2023-08-25 DIAGNOSIS — E1165 Type 2 diabetes mellitus with hyperglycemia: Secondary | ICD-10-CM | POA: Diagnosis not present

## 2023-08-25 DIAGNOSIS — R531 Weakness: Secondary | ICD-10-CM | POA: Diagnosis not present

## 2023-08-25 LAB — GLUCOSE, CAPILLARY
Glucose-Capillary: 124 mg/dL — ABNORMAL HIGH (ref 70–99)
Glucose-Capillary: 114 mg/dL — ABNORMAL HIGH (ref 70–99)
Glucose-Capillary: 116 mg/dL — ABNORMAL HIGH (ref 70–99)
Glucose-Capillary: 177 mg/dL — ABNORMAL HIGH (ref 70–99)

## 2023-08-25 MED ORDER — VITAMIN B-12 1000 MCG PO TABS
1000.0000 ug | ORAL_TABLET | Freq: Every day | ORAL | Status: DC
  Administered 2023-08-25 – 2023-08-27 (×3): 1000 ug via ORAL
  Filled 2023-08-25 (×3): qty 1

## 2023-08-25 NOTE — Progress Notes (Signed)
PROGRESS NOTE   David Herrera  ZOX:[id number]    DOB: [birth year]    DOA: 08/23/2023  PCP: Georgann Housekeeper, MD   I have briefly reviewed patients previous medical records in [facility].  Chief Complaint  Patient presents with   unable to walk for past week    Brief Narrative:   89 year old male, reports that he lives alone, ambulates with the help of a cane, states that his son lives within a mile away, PMH of HTN, HLD, DM 2, presented to the ED due to generalized weakness that has been ongoing for the last 2 weeks and since then had progressed to inability to walk.  He sustained a right clavicle fracture about 6 weeks prior, followed up with his orthopedic surgeon on day of ED visit and stable right clavicle shaft fracture was noted.  He was advised to gradually increase activity.   ED Course:  In the emergency department, BP was 115/57, other vital signs were within normal range.  Workup in the ED showed normocytic anemia, BMP showed sodium 131, potassium 3.3, chloride 96, bicarb 23, blood glucose 177, BUN 19, creatinine 0.74.  BNP 80, TSH 1.648, urinalysis was normal, magnesium 1.7 CT of head showed no acute intracranial abnormality Chest x-ray showed no active disease.  Patient ambulated 70 feet with PT on 8/24 and 120 feet on 8/25.  Patient was planned for discharge home with home health services on 8/24, however patient's son insisted that patient was unsafe to live alone, and wanted him to go to SNF for rehab.  DC canceled pending SNF.  TOC on board.   Assessment & Plan:  Principal Problem:   Generalized weakness Active Problems:   Essential hypertension   Weakness of right leg   Dehydration   Anemia of chronic disease   Hypokalemia   Hyponatremia   Type 2 diabetes mellitus with hyperglycemia (HCC)   Mixed hyperlipidemia   Generalized weakness Multifactorial related to very advanced age, mild hypokalemia and an element of mild dehydration.  This may be  complicating underlying cognitive impairment.?  B12 deficiency may be contributing. PT evaluated, ambulated 70 feet yesterday and 120 feet today.  PT recommends home health PT with rolling walker. On extensive discussion with patient's son yesterday and today at bedside, he feels that patient is still unsteady on his feet, does not feel that patient is safe to return home to live by himself, wants him to go to SNF for STR. TOC on board.  Hypokalemia: Was replaced yesterday.  Magnesium 2.2. Follow-up BMP in AM.  Anemia Mild.  Follow CBC in AM.  Outpatient follow-up.  DM2 A1c 6.9 Continue SSI while here and at discharge, continue metformin 500 Mg daily. Appropriate control for age  HTN Controlled on losartan, continue  HLD Continue simvastatin 10 Mg daily  Vitamin B12 deficiency B12: 102 Initiated B12 supplements by mouth.  Recommend repeating B12 levels in a couple of months and if still low then consider further evaluation and changing B12 to parenteral.  Suspect underlying cognitive impairment/possible dementia. Outpatient neuropsychiatry evaluation. Likely age-related.  Body mass index is 25.76 kg/m.   Pressure Ulcer: Pressure Injury 08/24/23 Foot Right;Lateral Stage 2 -  Partial thickness loss of dermis presenting as a shallow open injury with a red, pink wound bed without slough. (Active)  08/24/23 0100  Location: Foot  Location Orientation: Right;Lateral  Staging: Stage 2 -  Partial thickness loss of dermis presenting as a shallow open injury with a red, pink wound  bed without slough.  Wound Description (Comments):   Present on Admission: Yes  Dressing Type Foam - Lift dressing to assess site every shift 08/25/23 0900     Pressure Injury Thigh (Active)     Location: Thigh  Location Orientation:   Staging:   Wound Description (Comments):   Present on Admission: No  Dressing Type None 08/25/23 0900     Pressure Injury 08/24/23 Thigh Anterior;Left;Proximal  Stage 1 -  Intact skin with non-blanchable redness of a localized area usually over a bony prominence. non blanchable (Active)  08/24/23 0100  Location: Thigh  Location Orientation: Anterior;Left;Proximal  Staging: Stage 1 -  Intact skin with non-blanchable redness of a localized area usually over a bony prominence.  Wound Description (Comments): non blanchable  Present on Admission:   Dressing Type None 08/25/23 0900     Pressure Injury 08/24/23 Thigh Anterior;Proximal;Right Stage 1 -  Intact skin with non-blanchable redness of a localized area usually over a bony prominence. nonblanchable (Active)  08/24/23 0100  Location: Thigh  Location Orientation: Anterior;Proximal;Right  Staging: Stage 1 -  Intact skin with non-blanchable redness of a localized area usually over a bony prominence.  Wound Description (Comments): nonblanchable  Present on Admission: Yes  Dressing Type None 08/25/23 0900    ACP Documents: Has MOST form which has been scanned multiple times in [facility]. DVT prophylaxis: enoxaparin (LOVENOX) injection 40 mg Start: 08/24/23 1000 SCDs Start: 08/24/23 0249     Code Status: Full Code:  Family Communication: Discussed in detail with patient's son at bedside Disposition:  Status is: Observation The patient remains OBS appropriate and will d/c before 2 midnights.  Despite care crossing 2 midnights, patient lacks medical intensity for inpatient level care.  Has been medically optimized for DC since yesterday and awaiting SNF placement.     Consultants:     Procedures:     Antimicrobials:      Subjective:  Patient denies complaints.  Son at bedside laying in patient's bed and denies any acute issues.  Objective:   Vitals:   08/24/23 1930 08/25/23 0430 08/25/23 0800 08/25/23 1317  BP: 120/67 109/83 116/77 104/78  Pulse: 77 73 74 69  Resp: 18 18  20   Temp: 98 F (36.7 C) 98.5 F (36.9 C)  97.7 F (36.5 C)  TempSrc:  Oral  Oral  SpO2: 97% 98%  94%  Weight:         General exam: Elderly male, moderately built and nourished sitting up comfortably in reclining chair while son is lying in his hospital bed. Respiratory system: Clear to auscultation. Respiratory effort normal. Cardiovascular system: S1 & S2 heard, RRR. No JVD, murmurs, rubs, gallops or clicks. No pedal edema. Gastrointestinal system: Abdomen is nondistended, soft and nontender. No organomegaly or masses felt. Normal bowel sounds heard. Central nervous system: Alert and oriented x 2. No focal neurological deficits. Extremities: Symmetric 5 x 5 power.  Has knocked knees. Skin: No rashes, lesions or ulcers Psychiatry: Judgement and insight appear to be impaired. Mood & affect appropriate.     Data Reviewed:   I have personally reviewed following labs and imaging studies   CBC: Recent Labs  Lab 08/23/23 1951 08/24/23 0537  WBC 5.5 5.1  NEUTROABS 3.8  --   HGB 12.0* 11.3*  HCT 35.8* 33.9*  MCV 91.6 89.7  PLT 207 202    Basic Metabolic Panel: Recent Labs  Lab 08/23/23 1951 08/24/23 0537  NA 131* 135  K 3.3* 3.4*  CL 96*  104  CO2 23 25  GLUCOSE 177* 133*  BUN 19 12  CREATININE 0.74 0.61  CALCIUM 8.6* 8.4*  MG 1.7 2.2  PHOS  --  2.8    Liver Function Tests: Recent Labs  Lab 08/23/23 1951 08/24/23 0537  AST 18 16  ALT 18 16  ALKPHOS 92 70  BILITOT 0.6 0.7  PROT 6.4* 5.7*  ALBUMIN 3.5 3.0*    CBG: Recent Labs  Lab 08/24/23 2023 08/25/23 0747 08/25/23 1216  GLUCAP 116* 124* 114*    Microbiology Studies:  No results found for this or any previous visit (from the past 240 hour(s)).  Radiology Studies:  CT Head Wo Contrast  Result Date: 08/23/2023 CLINICAL DATA:  Neuro deficit, acute, stroke suspected EXAM: CT HEAD WITHOUT CONTRAST TECHNIQUE: Contiguous axial images were obtained from the base of the skull through the vertex without intravenous contrast. RADIATION DOSE REDUCTION: This exam was performed according to the departmental  dose-optimization program which includes automated exposure control, adjustment of the mA and/or kV according to patient size and/or use of iterative reconstruction technique. COMPARISON:  11/14/2016 FINDINGS: Brain: There is atrophy and chronic small vessel disease changes. No acute intracranial abnormality. Specifically, no hemorrhage, hydrocephalus, mass lesion, acute infarction, or significant intracranial injury. Vascular: No hyperdense vessel or unexpected calcification. Skull: No acute calvarial abnormality. Sinuses/Orbits: Inspissated mucus in the left frontal sinus with calcifications, unchanged. No air-fluid levels. Mastoid air cells clear. Other: None IMPRESSION: Atrophy, chronic microvascular disease. No acute intracranial abnormality. Electronically Signed   By: Charlett Nose M.D.   On: 08/23/2023 23:29   DG Chest Portable 1 View  Result Date: 08/23/2023 CLINICAL DATA:  Weakness EXAM: PORTABLE CHEST 1 VIEW COMPARISON:  08/25/2013 FINDINGS: Ununited displaced right mid clavicular fracture. No acute airspace disease. Stable cardiomediastinal silhouette with aortic atherosclerosis. Old appearing deformity of the proximal left humerus. IMPRESSION: 1. No active disease. Electronically Signed   By: Jasmine Pang M.D.   On: 08/23/2023 19:49    Scheduled Meds:    enoxaparin (LOVENOX) injection  40 mg Subcutaneous Q24H   insulin aspart  0-15 Units Subcutaneous TID WC   liver oil-zinc oxide   Topical BID   losartan  25 mg Oral Daily   simvastatin  10 mg Oral QPM    Continuous Infusions:     LOS: 1 day     Marcellus Scott, MD,  FACP, FHM, SFHM, [facility], [facility]   [facility] [place]     To contact the attending provider between 7A-7P or the covering provider during after hours 7P-7A, please log into the web site [email or web address] and access using universal [place] password for that web site. If you do not have the password, please call the hospital  operator.  08/25/2023, 2:19 PM

## 2023-08-25 NOTE — Progress Notes (Signed)
Patient slept most of the night during this shift. Attempted to get out of bed twice. Assisted with using the urinal at bedside both times. Very unsteady on his feet. Used walker to steadying assistance. Plan of care ongoing.

## 2023-08-25 NOTE — Progress Notes (Signed)
Physical Therapy Treatment Patient Details Name: David Herrera MRN: [id number] DOB: 05/08/1933 Today's Date: 08/25/2023   History of Present Illness  Per MD:  ANTAEUS RIDEOUT is an 89 y.o. male with medical history significant of hypertension, hyperlipidemia, T2DM who presents to the emergency department due to generalized weakness that has been ongoing since last 2 weeks which has since progressed to inability to walk.  He uses a cane at baseline.  Patient sustained right clavicle fracture about 6 weeks ago, he followed up with his orthopedic surgeon today and stable right clavicle shaft fracture was noted.  He was advised to gradually increase activity.     PT Comments  Pt seen second time with son present.  PT able to ambulate 120 ft with mod I with rolling walker.  Pt has stiffness and weakness in Rt LE which is not new.  Pt will continue to benefit from skilled PT to improve his balance.     If plan is discharge home, recommend the following: A little help with walking and/or transfers;Assistance with cooking/housework;Assist for transportation   Can travel by private vehicle      yes  Equipment Recommendations  Rolling walker (2 wheels)    Recommendations for Other Services OT consult     Precautions / Restrictions Precautions Precautions: Fall Restrictions Weight Bearing Restrictions: No     Mobility  Bed Mobility Overal bed mobility: Modified Independent                  Transfers Overall transfer level: Modified independent Equipment used: Rolling walker (2 wheels)               General transfer comment: from recliner    Ambulation/Gait Ambulation/Gait assistance: Modified independent (Device/Increase time) Gait Distance (Feet): 120 Feet Assistive device: Rolling walker (2 wheels) Gait Pattern/deviations: Decreased step length - left, Decreased step length - right                   Cognition Arousal: Alert   Overall Cognitive  Status: Within Functional Limits for tasks assessed                                                 Pertinent Vitals/Pain Pain Assessment Pain Assessment: No/denies pain           PT Goals (current goals can now be found in the care plan section) Progress towards PT goals: Progressing toward goals    Frequency    Min 2X/week      PT Plan  Continue with gait and exercise.        AM-PAC PT "6 Clicks" Mobility   Outcome Measure  Help needed turning from your back to your side while in a flat bed without using bedrails?: None Help needed moving from lying on your back to sitting on the side of a flat bed without using bedrails?: A Little Help needed moving to and from a bed to a chair (including a wheelchair)?: A Little Help needed standing up from a chair using your arms (e.g., wheelchair or bedside chair)?: A Little Help needed to walk in hospital room?: A Little Help needed climbing 3-5 steps with a railing? : A Little 6 Click Score: 19    End of Session Equipment Utilized During Treatment: Gait belt Activity Tolerance: Patient tolerated treatment well Patient left: in  chair;with call bell/phone within reach Nurse Communication: Mobility status PT Visit Diagnosis: Unsteadiness on feet (R26.81);Muscle weakness (generalized) (M62.81)     Time: 1000-1020 PT Time Calculation (min) (ACUTE ONLY): 20 min  Charges:    $Gait Training: 8-22 mins PT General Charges $$ ACUTE PT VISIT: 1 Visit                    Virgina Organ, PT CLT [phone]  08/25/2023, 10:48 AM

## 2023-08-25 NOTE — Plan of Care (Signed)

## 2023-08-26 DIAGNOSIS — E860 Dehydration: Secondary | ICD-10-CM | POA: Diagnosis not present

## 2023-08-26 DIAGNOSIS — Z85828 Personal history of other malignant neoplasm of skin: Secondary | ICD-10-CM | POA: Diagnosis not present

## 2023-08-26 DIAGNOSIS — E538 Deficiency of other specified B group vitamins: Secondary | ICD-10-CM | POA: Diagnosis not present

## 2023-08-26 DIAGNOSIS — I10 Essential (primary) hypertension: Secondary | ICD-10-CM | POA: Diagnosis not present

## 2023-08-26 DIAGNOSIS — Z7984 Long term (current) use of oral hypoglycemic drugs: Secondary | ICD-10-CM | POA: Diagnosis not present

## 2023-08-26 DIAGNOSIS — Z7982 Long term (current) use of aspirin: Secondary | ICD-10-CM | POA: Diagnosis not present

## 2023-08-26 DIAGNOSIS — L8990 Pressure ulcer of unspecified site, unspecified stage: Secondary | ICD-10-CM | POA: Diagnosis not present

## 2023-08-26 DIAGNOSIS — E1165 Type 2 diabetes mellitus with hyperglycemia: Secondary | ICD-10-CM | POA: Diagnosis not present

## 2023-08-26 DIAGNOSIS — R531 Weakness: Secondary | ICD-10-CM | POA: Diagnosis not present

## 2023-08-26 DIAGNOSIS — E440 Moderate protein-calorie malnutrition: Secondary | ICD-10-CM | POA: Diagnosis not present

## 2023-08-26 DIAGNOSIS — Z96641 Presence of right artificial hip joint: Secondary | ICD-10-CM | POA: Diagnosis not present

## 2023-08-26 DIAGNOSIS — E782 Mixed hyperlipidemia: Secondary | ICD-10-CM | POA: Diagnosis not present

## 2023-08-26 DIAGNOSIS — Z79899 Other long term (current) drug therapy: Secondary | ICD-10-CM | POA: Diagnosis not present

## 2023-08-26 DIAGNOSIS — E871 Hypo-osmolality and hyponatremia: Secondary | ICD-10-CM | POA: Diagnosis not present

## 2023-08-26 DIAGNOSIS — R0602 Shortness of breath: Secondary | ICD-10-CM | POA: Diagnosis not present

## 2023-08-26 DIAGNOSIS — E876 Hypokalemia: Secondary | ICD-10-CM | POA: Diagnosis not present

## 2023-08-26 LAB — BASIC METABOLIC PANEL
Anion gap: 7 (ref 5–15)
BUN: 13 mg/dL (ref 8–23)
CO2: 25 mmol/L (ref 22–32)
Calcium: 8.6 mg/dL — ABNORMAL LOW (ref 8.9–10.3)
Chloride: 103 mmol/L (ref 98–111)
Creatinine, Ser: 0.7 mg/dL (ref 0.61–1.24)
GFR, Estimated: >60 mL/min (ref >60)
Glucose, Bld: 113 mg/dL — ABNORMAL HIGH (ref 70–99)
Potassium: 3.8 mmol/L (ref 3.5–5.1)
Sodium: 135 mmol/L (ref 135–145)

## 2023-08-26 LAB — CBC
HCT: 36.5 % — ABNORMAL LOW (ref 39.0–52.0)
Hemoglobin: 11.9 g/dL — ABNORMAL LOW (ref 13.0–17.0)
MCH: 29.5 pg (ref 26.0–34.0)
MCHC: 32.6 g/dL (ref 30.0–36.0)
MCV: 90.6 fL (ref 80.0–100.0)
Platelets: 213 10*3/uL (ref 150–400)
RBC: 4.03 MIL/uL — ABNORMAL LOW (ref 4.22–5.81)
RDW: 12.9 % (ref 11.5–15.5)
WBC: 4.7 10*3/uL (ref 4.0–10.5)
nRBC: 0 % (ref 0.0–0.2)

## 2023-08-26 LAB — GLUCOSE, CAPILLARY
Glucose-Capillary: 132 mg/dL — ABNORMAL HIGH (ref 70–99)
Glucose-Capillary: 112 mg/dL — ABNORMAL HIGH (ref 70–99)
Glucose-Capillary: 163 mg/dL — ABNORMAL HIGH (ref 70–99)
Glucose-Capillary: 139 mg/dL — ABNORMAL HIGH (ref 70–99)

## 2023-08-26 MED ORDER — ADULT MULTIVITAMIN W/MINERALS CH
1.0000 | ORAL_TABLET | Freq: Every day | ORAL | Status: DC
  Administered 2023-08-26 – 2023-09-03 (×9): 1 via ORAL
  Filled 2023-08-26 (×9): qty 1

## 2023-08-26 MED ORDER — VITAMIN C 500 MG PO TABS
500.0000 mg | ORAL_TABLET | Freq: Two times a day (BID) | ORAL | Status: DC
  Administered 2023-08-26 – 2023-09-03 (×16): 500 mg via ORAL
  Filled 2023-08-26 (×17): qty 1

## 2023-08-26 MED ORDER — ZINC SULFATE 220 (50 ZN) MG PO CAPS
220.0000 mg | ORAL_CAPSULE | Freq: Every day | ORAL | Status: DC
  Administered 2023-08-26 – 2023-09-03 (×9): 220 mg via ORAL
  Filled 2023-08-26 (×9): qty 1

## 2023-08-26 MED ORDER — JUVEN PO PACK
1.0000 | PACK | Freq: Two times a day (BID) | ORAL | Status: DC
  Administered 2023-08-26 – 2023-09-03 (×14): 1 via ORAL
  Filled 2023-08-26 (×13): qty 1

## 2023-08-26 NOTE — Progress Notes (Signed)
Initial Nutrition Assessment  DOCUMENTATION CODES:   Not applicable  INTERVENTION:   -1 packet Juven BID, each packet provides 95 calories, 2.5 grams of protein (collagen), and 9.8 grams of carbohydrate (3 grams sugar); also contains 7 grams of L-arginine and L-glutamine, 300 mg vitamin C, 15 mg vitamin E, 1.2 mcg vitamin B-12, 9.5 mg zinc, 200 mg calcium, and 1.5 g  Calcium Beta-hydroxy-Beta-methylbutyrate to support wound healing  -MVI with minerals daily -500 mg vitamin C BID -220 mg zinc sulfate daily x 14 days -Liberalize diet to carb modified for wider variety of meal selections  NUTRITION DIAGNOSIS:   Increased nutrient needs related to wound healing as evidenced by estimated needs.  GOAL:   Patient will meet greater than or equal to 90% of their needs  MONITOR:   PO intake, Supplement acceptance  REASON FOR ASSESSMENT:   Malnutrition Screening Tool    ASSESSMENT:   Pt with medical history significant of hypertension, hyperlipidemia, T2DM who presents due to generalized weakness that has been ongoing since last 2 weeks which has since progressed to inability to walk.  Pt admitted with generalized weakness.   Reviewed I/O's: -260 ml x 24 hours and -3.2 L since admission  UOP: 1.1 L x 24 hours   Pt unavailable at time of visit. Attempted to speak with pt via call to hospital room phone, however, unable to reach. RD unable to obtain further nutrition-related history or complete nutrition-focused physical exam at this time.    Pt currently on a heart healthy/ carb modified diet. Noted meal completions 50-100%.   Pt with multiple areas of skin breakdown and would benefit from addition of oral nutrition supplements.   Reviewed wt hx; wt has been stable over the past several years. Per RN assessment, pt with mild edema, which may be masking true weight loss as well as fat and muscle depletions.   Per MD notes, plan SNF placement at discharge.   Medications reviewed  and include vitamin B-12.   Lab Results  Component Value Date   HGBA1C 6.9 (H) 08/24/2023   PTA DM medications are 500 mg metformin BID. Per ADA's Standards of Medical Care for Diabetes, glycemic targets for elderly patients who are otherwise healthy (few medical impairments) and cognitively intact should be less stringent (Hgb A1c <7.5).    Labs reviewed: CBGS: 114-177 (inpatient orders for glycemic control are 0-15 units insulin aspart TID with meals).    Diet Order:   Diet Order             Diet heart healthy/carb modified Room service appropriate? Yes; Fluid consistency: Thin  Diet effective now           Diet - low sodium heart healthy           Diet Carb Modified                   EDUCATION NEEDS:   No education needs have been identified at this time  Skin:  Skin Assessment: Skin Integrity Issues: Skin Integrity Issues:: Stage I, Other (Comment), Stage II Stage I: rt thigh, lt thigh Stage II: rt foot Other: IAD perineum  Last BM:  08/23/23  Height:   Ht Readings from Last 1 Encounters:  08/09/23 5' 5.5" (1.664 m)    Weight:   Wt Readings from Last 1 Encounters:  08/24/23 71.3 kg    Ideal Body Weight:  63.2 kg  BMI:  Body mass index is 25.76 kg/m.  Estimated Nutritional Needs:  Kcal:  1700-1900  Protein:  90-105 grams  Fluid:  > 1.7 L    Levada Schilling, RD, LDN, CDCES Registered Dietitian II Certified Diabetes Care and Education Specialist Please refer to [facility] for RD and/or RD on-call/weekend/after hours pager

## 2023-08-26 NOTE — TOC Progression Note (Signed)
Transition of Care [facility]) - Progression Note    Patient Details  Name: David Herrera MRN: [id number] Date of Birth: 15-Jun-1933  Transition of Care [facility]) CM/SW Contact  Annice Needy, LCSW Phone Number: 08/26/2023, 4:28 PM  Clinical Narrative:    Son accepts [facility] bed offer.      Barriers to Discharge: Continued Medical Work up  Expected Discharge Plan and Services         Expected Discharge Date: 08/24/23                                     Social Determinants of Health (SDOH) Interventions SDOH Screenings   Food Insecurity: No Food Insecurity (08/24/2023)  Housing: Low Risk  (08/24/2023)  Transportation Needs: No Transportation Needs (08/24/2023)  Utilities: Not At Risk (08/24/2023)  Tobacco Use: Low Risk  (08/23/2023)    Readmission Risk Interventions     No data to display

## 2023-08-26 NOTE — Progress Notes (Signed)
PROGRESS NOTE    David Herrera  VQQ:[id number] DOB: 1933/07/14 DOA: 08/23/2023 PCP: Georgann Housekeeper, MD   Brief Narrative:     89 year old male, reports that he lives alone, ambulates with the help of a cane, states that his son lives within a mile away, PMH of HTN, HLD, DM 2, presented to the ED due to generalized weakness that has been ongoing for the last 2 weeks and since then had progressed to inability to walk.  He sustained a right clavicle fracture about 6 weeks prior, followed up with his orthopedic surgeon on day of ED visit and stable right clavicle shaft fracture was noted.  He was advised to gradually increase activity.   ED Course:  In the emergency department, BP was 115/57, other vital signs were within normal range.  Workup in the ED showed normocytic anemia, BMP showed sodium 131, potassium 3.3, chloride 96, bicarb 23, blood glucose 177, BUN 19, creatinine 0.74.  BNP 80, TSH 1.648, urinalysis was normal, magnesium 1.7 CT of head showed no acute intracranial abnormality Chest x-ray showed no active disease.   Patient ambulated 70 feet with PT on 8/24 and 120 feet on 8/25.  Patient was planned for discharge home with home health services on 8/24, however patient's son insisted that patient was unsafe to live alone, and wanted him to go to SNF for rehab.  DC canceled pending SNF.  TOC on board.  Assessment & Plan:   Principal Problem:   Generalized weakness Active Problems:   Essential hypertension   Weakness of right leg   Dehydration   Anemia of chronic disease   Hypokalemia   Hyponatremia   Type 2 diabetes mellitus with hyperglycemia (HCC)   Mixed hyperlipidemia  Assessment and Plan:  Generalized weakness Multifactorial related to very advanced age, mild hypokalemia and an element of mild dehydration.  This may be complicating underlying cognitive impairment.?  B12 deficiency may be contributing. PT evaluated, ambulated 70 feet yesterday and 120 feet today.   PT recommends home health PT with rolling walker. On extensive discussion with patient's son yesterday and today at bedside, he feels that patient is still unsteady on his feet, does not feel that patient is safe to return home to live by himself, wants him to go to SNF for STR. TOC on board.   Anemia-stable Outpatient follow-up.   DM2 A1c 6.9 Continue SSI while here and at discharge, continue metformin 500 Mg daily. Appropriate control for age   HTN Controlled on losartan, continue   HLD Continue simvastatin 10 Mg daily   Vitamin B12 deficiency B12: 102 Initiated B12 supplements by mouth.  Recommend repeating B12 levels in a couple of months and if still low then consider further evaluation and changing B12 to parenteral.   Suspect underlying cognitive impairment/possible dementia. Outpatient neuropsychiatry evaluation. Likely age-related.   Body mass index is 25.76 kg/m.     Pressure Ulcer:     Pressure Injury 08/24/23 Foot Right;Lateral Stage 2 -  Partial thickness loss of dermis presenting as a shallow open injury with a red, pink wound bed without slough. (Active)  08/24/23 0100  Location: Foot  Location Orientation: Right;Lateral  Staging: Stage 2 -  Partial thickness loss of dermis presenting as a shallow open injury with a red, pink wound bed without slough.  Wound Description (Comments):   Present on Admission: Yes  Dressing Type Foam - Lift dressing to assess site every shift 08/25/23 0900     Pressure Injury Thigh (Active)  Location: Thigh  Location Orientation:   Staging:   Wound Description (Comments):   Present on Admission: No  Dressing Type None 08/25/23 0900     Pressure Injury 08/24/23 Thigh Anterior;Left;Proximal Stage 1 -  Intact skin with non-blanchable redness of a localized area usually over a bony prominence. non blanchable (Active)  08/24/23 0100  Location: Thigh  Location Orientation: Anterior;Left;Proximal  Staging: Stage 1 -  Intact  skin with non-blanchable redness of a localized area usually over a bony prominence.  Wound Description (Comments): non blanchable  Present on Admission:   Dressing Type None 08/25/23 0900     Pressure Injury 08/24/23 Thigh Anterior;Proximal;Right Stage 1 -  Intact skin with non-blanchable redness of a localized area usually over a bony prominence. nonblanchable (Active)  08/24/23 0100  Location: Thigh  Location Orientation: Anterior;Proximal;Right  Staging: Stage 1 -  Intact skin with non-blanchable redness of a localized area usually over a bony prominence.  Wound Description (Comments): nonblanchable  Present on Admission: Yes  Dressing Type None 08/25/23 0900   DVT prophylaxis:Lovenox Code Status: Full Family Communication: None at bedside Disposition Plan:  Status is: Observation The patient remains OBS appropriate and will d/c before 2 midnights.  Consultants:  None  Procedures:  None  Antimicrobials:  Anti-infectives (From admission, onward)    None       Subjective: Patient seen and evaluated today with no new acute complaints or concerns. No acute concerns or events noted overnight.  Objective: Vitals:   08/25/23 1317 08/25/23 2010 08/26/23 0500 08/26/23 0808  BP: 104/78 125/65 130/87 131/84  Pulse: 69 74 69 78  Resp: 20 19 18 18   Temp: 97.7 F (36.5 C) 98.1 F (36.7 C) 98.2 F (36.8 C) 98.4 F (36.9 C)  TempSrc: Oral Oral Oral Oral  SpO2: 94% 97% 99% 100%  Weight:        Intake/Output Summary (Last 24 hours) at 08/26/2023 1340 Last data filed at 08/26/2023 1200 Gross per 24 hour  Intake 1200 ml  Output 1500 ml  Net -300 ml   Filed Weights   08/24/23 0014  Weight: 71.3 kg    Examination:  General exam: Appears calm and comfortable  Respiratory system: Clear to auscultation. Respiratory effort normal. Cardiovascular system: S1 & S2 heard, RRR.  Gastrointestinal system: Abdomen is soft Central nervous system: Alert and awake Extremities:  No edema Skin: No significant lesions noted Psychiatry: Flat affect.    Data Reviewed: I have personally reviewed following labs and imaging studies  CBC: Recent Labs  Lab 08/23/23 1951 08/24/23 0537 08/26/23 0408  WBC 5.5 5.1 4.7  NEUTROABS 3.8  --   --   HGB 12.0* 11.3* 11.9*  HCT 35.8* 33.9* 36.5*  MCV 91.6 89.7 90.6  PLT 207 202 213   Basic Metabolic Panel: Recent Labs  Lab 08/23/23 1951 08/24/23 0537 08/26/23 0408  NA 131* 135 135  K 3.3* 3.4* 3.8  CL 96* 104 103  CO2 23 25 25   GLUCOSE 177* 133* 113*  BUN 19 12 13   CREATININE 0.74 0.61 0.70  CALCIUM 8.6* 8.4* 8.6*  MG 1.7 2.2  --   PHOS  --  2.8  --    GFR: Estimated Creatinine Clearance: 57.7 mL/min (by C-G formula based on SCr of 0.7 mg/dL). Liver Function Tests: Recent Labs  Lab 08/23/23 1951 08/24/23 0537  AST 18 16  ALT 18 16  ALKPHOS 92 70  BILITOT 0.6 0.7  PROT 6.4* 5.7*  ALBUMIN 3.5 3.0*  No results for input(s): "LIPASE", "AMYLASE" in the last 168 hours. No results for input(s): "AMMONIA" in the last 168 hours. Coagulation Profile: No results for input(s): "INR", "PROTIME" in the last 168 hours. Cardiac Enzymes: No results for input(s): "CKTOTAL", "CKMB", "CKMBINDEX", "TROPONINI" in the last 168 hours. BNP (last 3 results) No results for input(s): "PROBNP" in the last 8760 hours. HbA1C: Recent Labs    08/24/23 0537  HGBA1C 6.9*   CBG: Recent Labs  Lab 08/25/23 1216 08/25/23 1610 08/25/23 2013 08/26/23 0748 08/26/23 1118  GLUCAP 114* 116* 177* 132* 112*   Lipid Profile: No results for input(s): "CHOL", "HDL", "LDLCALC", "TRIG", "CHOLHDL", "LDLDIRECT" in the last 72 hours. Thyroid Function Tests: Recent Labs    08/23/23 1951  TSH 1.648   Anemia Panel: Recent Labs    08/24/23 0537  VITAMINB12 102*  FOLATE 13.8   Sepsis Labs: No results for input(s): "PROCALCITON", "LATICACIDVEN" in the last 168 hours.  No results found for this or any previous visit (from the  past 240 hour(s)).       Radiology Studies: No results found.      Scheduled Meds:  ascorbic acid  500 mg Oral BID   vitamin B-12  1,000 mcg Oral Daily   enoxaparin (LOVENOX) injection  40 mg Subcutaneous Q24H   insulin aspart  0-15 Units Subcutaneous TID WC   liver oil-zinc oxide   Topical BID   losartan  25 mg Oral Daily   multivitamin with minerals  1 tablet Oral Daily   nutrition supplement (JUVEN)  1 packet Oral BID BM   simvastatin  10 mg Oral QPM   zinc sulfate  220 mg Oral Daily     LOS: 0 days    Time spent: 35 minutes    Joh Rao Hoover Brunette, DO [facility]  If 7PM-7AM, please contact night-coverage [email or web address] 08/26/2023, 1:40 PM

## 2023-08-27 DIAGNOSIS — Z7982 Long term (current) use of aspirin: Secondary | ICD-10-CM | POA: Diagnosis not present

## 2023-08-27 DIAGNOSIS — Z79899 Other long term (current) drug therapy: Secondary | ICD-10-CM | POA: Diagnosis not present

## 2023-08-27 DIAGNOSIS — E871 Hypo-osmolality and hyponatremia: Secondary | ICD-10-CM | POA: Diagnosis not present

## 2023-08-27 DIAGNOSIS — R0602 Shortness of breath: Secondary | ICD-10-CM | POA: Diagnosis not present

## 2023-08-27 DIAGNOSIS — I10 Essential (primary) hypertension: Secondary | ICD-10-CM | POA: Diagnosis not present

## 2023-08-27 DIAGNOSIS — Z96641 Presence of right artificial hip joint: Secondary | ICD-10-CM | POA: Diagnosis not present

## 2023-08-27 DIAGNOSIS — Z85828 Personal history of other malignant neoplasm of skin: Secondary | ICD-10-CM | POA: Diagnosis not present

## 2023-08-27 DIAGNOSIS — E1165 Type 2 diabetes mellitus with hyperglycemia: Secondary | ICD-10-CM | POA: Diagnosis not present

## 2023-08-27 DIAGNOSIS — E440 Moderate protein-calorie malnutrition: Secondary | ICD-10-CM | POA: Diagnosis not present

## 2023-08-27 DIAGNOSIS — E860 Dehydration: Secondary | ICD-10-CM | POA: Diagnosis not present

## 2023-08-27 DIAGNOSIS — Z7984 Long term (current) use of oral hypoglycemic drugs: Secondary | ICD-10-CM | POA: Diagnosis not present

## 2023-08-27 DIAGNOSIS — E782 Mixed hyperlipidemia: Secondary | ICD-10-CM | POA: Diagnosis not present

## 2023-08-27 DIAGNOSIS — R531 Weakness: Secondary | ICD-10-CM | POA: Diagnosis not present

## 2023-08-27 DIAGNOSIS — L8990 Pressure ulcer of unspecified site, unspecified stage: Secondary | ICD-10-CM | POA: Diagnosis not present

## 2023-08-27 DIAGNOSIS — E876 Hypokalemia: Secondary | ICD-10-CM | POA: Diagnosis not present

## 2023-08-27 DIAGNOSIS — E538 Deficiency of other specified B group vitamins: Secondary | ICD-10-CM | POA: Diagnosis not present

## 2023-08-27 LAB — GLUCOSE, CAPILLARY
Glucose-Capillary: 164 mg/dL — ABNORMAL HIGH (ref 70–99)
Glucose-Capillary: 150 mg/dL — ABNORMAL HIGH (ref 70–99)
Glucose-Capillary: 135 mg/dL — ABNORMAL HIGH (ref 70–99)
Glucose-Capillary: 190 mg/dL — ABNORMAL HIGH (ref 70–99)

## 2023-08-27 NOTE — TOC Progression Note (Signed)
Transition of Care [facility]) - Progression Note    Patient Details  Name: David Herrera MRN: [id number] Date of Birth: April 20, 1933  Transition of [facility]) CM/SW Contact  Elliot Gault, LCSW Phone Number: 08/27/2023, 4:05 PM  Clinical Narrative:     TOC following. Insurance offered Peer to Peer for SNF auth which was completed by Dr. Sherryll Burger. SNF auth still denied.   Updated son who would like to appeal. Awaiting info on appeal options and will update son once known. Explained to pt's son that pt/family can private pay at SNF if insurance continues to deny coverage.  Updated MD. Jory Sims will follow.    Barriers to Discharge: Continued Medical Work up  Expected Discharge Plan and Services         Expected Discharge Date: 08/24/23                                     Social Determinants of Health (SDOH) Interventions SDOH Screenings   Food Insecurity: No Food Insecurity (08/24/2023)  Housing: Low Risk  (08/24/2023)  Transportation Needs: No Transportation Needs (08/24/2023)  Utilities: Not At Risk (08/24/2023)  Tobacco Use: Low Risk  (08/23/2023)    Readmission Risk Interventions     No data to display

## 2023-08-27 NOTE — Progress Notes (Signed)
PROGRESS NOTE    David Herrera  ZOX:[id number] DOB: 09-10-1933 DOA: 08/23/2023 PCP: Georgann Housekeeper, MD   Brief Narrative:     89 year old male, reports that he lives alone, ambulates with the help of a cane, states that his son lives within a mile away, PMH of HTN, HLD, DM 2, presented to the ED due to generalized weakness that has been ongoing for the last 2 weeks and since then had progressed to inability to walk.  He sustained a right clavicle fracture about 6 weeks prior, followed up with his orthopedic surgeon on day of ED visit and stable right clavicle shaft fracture was noted.  He was advised to gradually increase activity.   ED Course:  In the emergency department, BP was 115/57, other vital signs were within normal range.  Workup in the ED showed normocytic anemia, BMP showed sodium 131, potassium 3.3, chloride 96, bicarb 23, blood glucose 177, BUN 19, creatinine 0.74.  BNP 80, TSH 1.648, urinalysis was normal, magnesium 1.7 CT of head showed no acute intracranial abnormality Chest x-ray showed no active disease.   Patient ambulated 70 feet with PT on 8/24 and 120 feet on 8/25.  Patient was planned for discharge home with home health services on 8/24, however patient's son insisted that patient was unsafe to live alone, and wanted him to go to SNF for rehab.  His insurance has denied authorization and patient is now appealing.  Assessment & Plan:   Principal Problem:   Generalized weakness Active Problems:   Essential hypertension   Weakness of right leg   Dehydration   Anemia of chronic disease   Hypokalemia   Hyponatremia   Type 2 diabetes mellitus with hyperglycemia (HCC)   Mixed hyperlipidemia  Assessment and Plan:  Generalized weakness Multifactorial related to very advanced age, mild hypokalemia and an element of mild dehydration.  This may be complicating underlying cognitive impairment.?  B12 deficiency may be contributing. PT evaluated, ambulated 70 feet  yesterday and 120 feet today.  PT recommends home health PT with rolling walker. On extensive discussion with patient's son yesterday and today at bedside, he feels that patient is still unsteady on his feet, does not feel that patient is safe to return home to live by himself, wants him to go to SNF for STR. TOC on board.   Anemia-stable Outpatient follow-up.   DM2 A1c 6.9 Continue SSI while here and at discharge, continue metformin 500 Mg daily. Appropriate control for age   HTN Controlled on losartan, continue   HLD Continue simvastatin 10 Mg daily   Vitamin B12 deficiency B12: 102 Initiated B12 supplements by mouth.  Recommend repeating B12 levels in a couple of months and if still low then consider further evaluation and changing B12 to parenteral.   Suspect underlying cognitive impairment/possible dementia. Outpatient neuropsychiatry evaluation. Likely age-related.   Body mass index is 25.76 kg/m.     Pressure Ulcer:     Pressure Injury 08/24/23 Foot Right;Lateral Stage 2 -  Partial thickness loss of dermis presenting as a shallow open injury with a red, pink wound bed without slough. (Active)  08/24/23 0100  Location: Foot  Location Orientation: Right;Lateral  Staging: Stage 2 -  Partial thickness loss of dermis presenting as a shallow open injury with a red, pink wound bed without slough.  Wound Description (Comments):   Present on Admission: Yes  Dressing Type Foam - Lift dressing to assess site every shift 08/25/23 0900     Pressure Injury Thigh (  Active)     Location: Thigh  Location Orientation:   Staging:   Wound Description (Comments):   Present on Admission: No  Dressing Type None 08/25/23 0900     Pressure Injury 08/24/23 Thigh Anterior;Left;Proximal Stage 1 -  Intact skin with non-blanchable redness of a localized area usually over a bony prominence. non blanchable (Active)  08/24/23 0100  Location: Thigh  Location Orientation:  Anterior;Left;Proximal  Staging: Stage 1 -  Intact skin with non-blanchable redness of a localized area usually over a bony prominence.  Wound Description (Comments): non blanchable  Present on Admission:   Dressing Type None 08/25/23 0900     Pressure Injury 08/24/23 Thigh Anterior;Proximal;Right Stage 1 -  Intact skin with non-blanchable redness of a localized area usually over a bony prominence. nonblanchable (Active)  08/24/23 0100  Location: Thigh  Location Orientation: Anterior;Proximal;Right  Staging: Stage 1 -  Intact skin with non-blanchable redness of a localized area usually over a bony prominence.  Wound Description (Comments): nonblanchable  Present on Admission: Yes  Dressing Type None 08/25/23 0900   DVT prophylaxis:Lovenox Code Status: Full Family Communication: None at bedside Disposition Plan:  Status is: Observation The patient remains OBS appropriate and will d/c before 2 midnights.  Consultants:  None  Procedures:  None  Antimicrobials:  Anti-infectives (From admission, onward)    None       Subjective: Patient seen and evaluated today with no new acute complaints or concerns. No acute concerns or events noted overnight.  Objective: Vitals:   08/26/23 1451 08/26/23 2016 08/27/23 0439 08/27/23 1315  BP: 114/70 129/72 134/65 135/65  Pulse: 73 89 88 80  Resp:  16 16 17   Temp:  98.4 F (36.9 C) 98.1 F (36.7 C) 98.6 F (37 C)  TempSrc:  Oral Oral Oral  SpO2:  99% 96% 97%  Weight:        Intake/Output Summary (Last 24 hours) at 08/27/2023 1607 Last data filed at 08/27/2023 1300 Gross per 24 hour  Intake 1080 ml  Output 900 ml  Net 180 ml   Filed Weights   08/24/23 0014  Weight: 71.3 kg    Examination:  General exam: Appears calm and comfortable  Respiratory system: Clear to auscultation. Respiratory effort normal. Cardiovascular system: S1 & S2 heard, RRR.  Gastrointestinal system: Abdomen is soft Central nervous system: Alert and  awake Extremities: No edema Skin: No significant lesions noted Psychiatry: Flat affect.    Data Reviewed: I have personally reviewed following labs and imaging studies  CBC: Recent Labs  Lab 08/23/23 1951 08/24/23 0537 08/26/23 0408  WBC 5.5 5.1 4.7  NEUTROABS 3.8  --   --   HGB 12.0* 11.3* 11.9*  HCT 35.8* 33.9* 36.5*  MCV 91.6 89.7 90.6  PLT 207 202 213   Basic Metabolic Panel: Recent Labs  Lab 08/23/23 1951 08/24/23 0537 08/26/23 0408  NA 131* 135 135  K 3.3* 3.4* 3.8  CL 96* 104 103  CO2 23 25 25   GLUCOSE 177* 133* 113*  BUN 19 12 13   CREATININE 0.74 0.61 0.70  CALCIUM 8.6* 8.4* 8.6*  MG 1.7 2.2  --   PHOS  --  2.8  --    GFR: Estimated Creatinine Clearance: 57.7 mL/min (by C-G formula based on SCr of 0.7 mg/dL). Liver Function Tests: Recent Labs  Lab 08/23/23 1951 08/24/23 0537  AST 18 16  ALT 18 16  ALKPHOS 92 70  BILITOT 0.6 0.7  PROT 6.4* 5.7*  ALBUMIN 3.5 3.0*  No results for input(s): "LIPASE", "AMYLASE" in the last 168 hours. No results for input(s): "AMMONIA" in the last 168 hours. Coagulation Profile: No results for input(s): "INR", "PROTIME" in the last 168 hours. Cardiac Enzymes: No results for input(s): "CKTOTAL", "CKMB", "CKMBINDEX", "TROPONINI" in the last 168 hours. BNP (last 3 results) No results for input(s): "PROBNP" in the last 8760 hours. HbA1C: No results for input(s): "HGBA1C" in the last 72 hours.  CBG: Recent Labs  Lab 08/26/23 1118 08/26/23 1619 08/26/23 2016 08/27/23 0724 08/27/23 1104  GLUCAP 112* 163* 139* 164* 150*   Lipid Profile: No results for input(s): "CHOL", "HDL", "LDLCALC", "TRIG", "CHOLHDL", "LDLDIRECT" in the last 72 hours. Thyroid Function Tests: No results for input(s): "TSH", "T4TOTAL", "FREET4", "T3FREE", "THYROIDAB" in the last 72 hours.  Anemia Panel: No results for input(s): "VITAMINB12", "FOLATE", "FERRITIN", "TIBC", "IRON", "RETICCTPCT" in the last 72 hours.  Sepsis Labs: No results  for input(s): "PROCALCITON", "LATICACIDVEN" in the last 168 hours.  No results found for this or any previous visit (from the past 240 hour(s)).       Radiology Studies: No results found.      Scheduled Meds:  ascorbic acid  500 mg Oral BID   vitamin B-12  1,000 mcg Oral Daily   enoxaparin (LOVENOX) injection  40 mg Subcutaneous Q24H   insulin aspart  0-15 Units Subcutaneous TID WC   liver oil-zinc oxide   Topical BID   losartan  25 mg Oral Daily   multivitamin with minerals  1 tablet Oral Daily   nutrition supplement (JUVEN)  1 packet Oral BID BM   simvastatin  10 mg Oral QPM   zinc sulfate  220 mg Oral Daily     LOS: 0 days    Time spent: 35 minutes    David Gnau Hoover Brunette, DO [facility]  If 7PM-7AM, please contact night-coverage [email or web address] 08/27/2023, 4:07 PM

## 2023-08-27 NOTE — Evaluation (Signed)
Occupational Therapy Evaluation Patient Details Name: David Herrera MRN: [id number] DOB: Jun 07, 1933 Today's Date: 08/27/2023   History of Present Illness DETRICK LOCKERMAN is an 89 y.o. male with medical history significant of hypertension, hyperlipidemia, T2DM who presents to the emergency department due to generalized weakness that has been ongoing since last 2 weeks which has since progressed to inability to walk.  He uses a cane at baseline.  Patient sustained right clavicle fracture about 6 weeks ago, he followed up with his orthopedic surgeon today and stable right clavicle shaft fracture was noted.  He was advised to gradually increase activity.  Patient denies fever, chills, falls, decrease in appetite, depression. (per DO)   Clinical Impression   Pt agreeable to OT evaluation. Pt was seated in the chair at the start of the session. Pt required min A for sit to stand from chair and toilet today. More contact guard assist once ambulating with RW. Pt typically used a cane at baseline but required a RW today. Pt demonstrates good ability to complete seated ADL's with set up assist due to ambulation deficits. Pt was left in the chair with the chair alarm set. Pt will benefit from continued OT in the hospital and recommended venue below to increase strength, balance, and endurance for safe ADL's.         If plan is discharge home, recommend the following: A little help with walking and/or transfers;A little help with bathing/dressing/bathroom;Assistance with cooking/housework;Assist for transportation;Help with stairs or ramp for entrance    Functional Status Assessment  Patient has had a recent decline in their functional status and demonstrates the ability to make significant improvements in function in a reasonable and predictable amount of time.  Equipment Recommendations  None recommended by OT           Precautions / Restrictions Precautions Precautions:  Fall Restrictions Weight Bearing Restrictions: No      Mobility Bed Mobility               General bed mobility comments: Pt seated in the chair at the start of the session.    Transfers Overall transfer level: Needs assistance Equipment used: Rolling walker (2 wheels) Transfers: Sit to/from Stand, Bed to chair/wheelchair/BSC Sit to Stand: Min assist     Step pivot transfers: Contact guard assist, Min assist     General transfer comment: Assit to boost from chair and from toilet. Unsteady in standing initially.      Balance Overall balance assessment: Needs assistance Sitting-balance support: Bilateral upper extremity supported, Feet supported Sitting balance-Leahy Scale: Good Sitting balance - Comments: seated in recliner   Standing balance support: Bilateral upper extremity supported, During functional activity, Reliant on assistive device for balance Standing balance-Leahy Scale: Fair Standing balance comment: poor progressing to fair using RW                           ADL either performed or assessed with clinical judgement   ADL Overall ADL's : Needs assistance/impaired     Grooming: Contact guard assist;Minimal assistance;Standing   Upper Body Bathing: Set up;Sitting   Lower Body Bathing: Set up;Sitting/lateral leans   Upper Body Dressing : Set up;Sitting   Lower Body Dressing: Set up;Sitting/lateral leans   Toilet Transfer: Contact guard assist;Minimal assistance;Rolling walker (2 wheels);Ambulation Toilet Transfer Details (indicate cue type and reason): Chair to toilet and back with RW Toileting- Clothing Manipulation and Hygiene: Set up;Contact guard assist;Sitting/lateral lean  Functional mobility during ADLs: Contact guard assist;Rolling walker (2 wheels) General ADL Comments: Able to ambulate ~10 to 15 feet within the room.     Vision Baseline Vision/History:  (hsitory of cataracts removal) Ability to See in Adequate  Light: 1 Impaired Patient Visual Report: Other (comment) (Some blurrying of vision when pt stands, per report.) Vision Assessment?: No apparent visual deficits     Perception Perception: Not tested       Praxis Praxis: Not tested       Pertinent Vitals/Pain Pain Assessment Pain Assessment: No/denies pain     Extremity/Trunk Assessment Upper Extremity Assessment Upper Extremity Assessment: Overall WFL for tasks assessed   Lower Extremity Assessment Lower Extremity Assessment: Defer to PT evaluation   Cervical / Trunk Assessment Cervical / Trunk Assessment: Normal   Communication Communication Communication: No apparent difficulties   Cognition Arousal: Alert Behavior During Therapy: WFL for tasks assessed/performed Overall Cognitive Status: Within Functional Limits for tasks assessed                                                        Home Herrera Family/patient expects to be discharged to:: Private residence Herrera Arrangements: Children Available Help at Discharge: Available PRN/intermittently;Family Type of Home: House Home Access: Ramped entrance     Home Layout: One level     Bathroom Shower/Tub: Chief Strategy Officer: Standard     Home Equipment: Cane - single Librarian, academic (2 wheels);Grab bars - toilet          Prior Functioning/Environment Prior Level of Function : Independent/Modified Independent             Mobility Comments: Ambulates with SPC ADLs Comments: Independent ADL and IADL.        OT Problem List: Decreased strength;Decreased activity tolerance;Impaired balance (sitting and/or standing)      OT Treatment/Interventions: Self-care/ADL training;Therapeutic exercise;Therapeutic activities;Balance training;Patient/family education;DME and/or AE instruction    OT Goals(Current goals can be found in the care plan section) Acute Rehab OT Goals Patient Stated Goal: improve function OT  Goal Formulation: With patient Time For Goal Achievement: 09/10/23 Potential to Achieve Goals: Good  OT Frequency: Min 2X/week                  AM-PAC OT "6 Clicks" Daily Activity     Outcome Measure Help from another person eating meals?: None Help from another person taking care of personal grooming?: A Little Help from another person toileting, which includes using toliet, bedpan, or urinal?: A Little Help from another person bathing (including washing, rinsing, drying)?: A Little Help from another person to put on and taking off regular upper body clothing?: A Little Help from another person to put on and taking off regular lower body clothing?: A Little 6 Click Score: 19   End of Session Equipment Utilized During Treatment: Rolling walker (2 wheels);Gait belt  Activity Tolerance: Patient tolerated treatment well Patient left: in chair;with call bell/phone within reach;with chair alarm set  OT Visit Diagnosis: Unsteadiness on feet (R26.81);Other abnormalities of gait and mobility (R26.89);Muscle weakness (generalized) (M62.81)                Time: 4098-1191 OT Time Calculation (min): 15 min Charges:  OT General Charges $OT Visit: 1 Visit OT Evaluation $OT Eval Low Complexity: 1 Low  Mccrae Speciale OT, MOT  Danie Chandler 08/27/2023, 9:48 AM

## 2023-08-27 NOTE — Plan of Care (Signed)
  Problem: Acute Rehab OT Goals (only OT should resolve) Goal: Pt. Will Perform Grooming Flowsheets (Taken 08/27/2023 0951) Pt Will Perform Grooming:  with modified independence  standing Goal: Pt. Will Perform Lower Body Bathing Flowsheets (Taken 08/27/2023 0951) Pt Will Perform Lower Body Bathing:  with modified independence  sitting/lateral leans Goal: Pt. Will Perform Lower Body Dressing Flowsheets (Taken 08/27/2023 0951) Pt Will Perform Lower Body Dressing:  with modified independence  sitting/lateral leans Goal: Pt. Will Transfer To Toilet Flowsheets (Taken 08/27/2023 [phone]) Pt Will Transfer to Toilet:  with modified independence  ambulating  Cobe Viney OT, MOT

## 2023-08-28 DIAGNOSIS — E538 Deficiency of other specified B group vitamins: Secondary | ICD-10-CM

## 2023-08-28 DIAGNOSIS — E782 Mixed hyperlipidemia: Secondary | ICD-10-CM | POA: Diagnosis not present

## 2023-08-28 DIAGNOSIS — Z7982 Long term (current) use of aspirin: Secondary | ICD-10-CM | POA: Diagnosis not present

## 2023-08-28 DIAGNOSIS — Z85828 Personal history of other malignant neoplasm of skin: Secondary | ICD-10-CM | POA: Diagnosis not present

## 2023-08-28 DIAGNOSIS — Z7984 Long term (current) use of oral hypoglycemic drugs: Secondary | ICD-10-CM | POA: Diagnosis not present

## 2023-08-28 DIAGNOSIS — L8990 Pressure ulcer of unspecified site, unspecified stage: Secondary | ICD-10-CM | POA: Diagnosis not present

## 2023-08-28 DIAGNOSIS — E860 Dehydration: Secondary | ICD-10-CM | POA: Diagnosis not present

## 2023-08-28 DIAGNOSIS — R531 Weakness: Secondary | ICD-10-CM | POA: Diagnosis not present

## 2023-08-28 DIAGNOSIS — E871 Hypo-osmolality and hyponatremia: Secondary | ICD-10-CM | POA: Diagnosis not present

## 2023-08-28 DIAGNOSIS — Z96641 Presence of right artificial hip joint: Secondary | ICD-10-CM | POA: Diagnosis not present

## 2023-08-28 DIAGNOSIS — I10 Essential (primary) hypertension: Secondary | ICD-10-CM | POA: Diagnosis not present

## 2023-08-28 DIAGNOSIS — Z79899 Other long term (current) drug therapy: Secondary | ICD-10-CM | POA: Diagnosis not present

## 2023-08-28 DIAGNOSIS — E876 Hypokalemia: Secondary | ICD-10-CM | POA: Diagnosis not present

## 2023-08-28 DIAGNOSIS — E440 Moderate protein-calorie malnutrition: Secondary | ICD-10-CM | POA: Diagnosis not present

## 2023-08-28 DIAGNOSIS — R0602 Shortness of breath: Secondary | ICD-10-CM | POA: Diagnosis not present

## 2023-08-28 DIAGNOSIS — E1165 Type 2 diabetes mellitus with hyperglycemia: Secondary | ICD-10-CM | POA: Diagnosis not present

## 2023-08-28 LAB — GLUCOSE, CAPILLARY
Glucose-Capillary: 149 mg/dL — ABNORMAL HIGH (ref 70–99)
Glucose-Capillary: 122 mg/dL — ABNORMAL HIGH (ref 70–99)
Glucose-Capillary: 162 mg/dL — ABNORMAL HIGH (ref 70–99)
Glucose-Capillary: 170 mg/dL — ABNORMAL HIGH (ref 70–99)

## 2023-08-28 MED ORDER — CYANOCOBALAMIN 1000 MCG/ML IJ SOLN
1000.0000 ug | Freq: Every day | INTRAMUSCULAR | Status: DC
  Administered 2023-08-28 – 2023-09-03 (×7): 1000 ug via INTRAMUSCULAR
  Filled 2023-08-28 (×7): qty 1

## 2023-08-28 NOTE — Plan of Care (Signed)

## 2023-08-28 NOTE — Progress Notes (Signed)
Physical Therapy Treatment Patient Details Name: David Herrera MRN: [id number] DOB: 12/10/1933 Today's Date: 08/28/2023   History of Present Illness David Herrera is an 89 y.o. male with medical history significant of hypertension, hyperlipidemia, T2DM who presents to the emergency department due to generalized weakness that has been ongoing since last 2 weeks which has since progressed to inability to walk.  He uses a cane at baseline.  Patient sustained right clavicle fracture about 6 weeks ago, he followed up with his orthopedic surgeon today and stable right clavicle shaft fracture was noted.  He was advised to gradually increase activity.  Patient denies fever, chills, falls, decrease in appetite, depression.    PT Comments  Patient presents seated in chair and agreeable for therapy.  Patient has most difficulty completing sit to stands due to BLE weakness and requiring repeated verbal/tactile cueing for safety, proper hand placement with fair/poor carryover, once standing able to transfer to/from bed with Min assist, but continues to be fall risk due to BLE weakness.  Patient able to ambulate in room/hallway with slow labored cadence and limited mostly due to c/o fatigue and generalized weakness.  Patient tolerated staying up in chair with family member and visitors present after therapy.  Patient will benefit from continued skilled physical therapy in hospital and recommended venue below to increase strength, balance, endurance for safe ADLs and gait.     If plan is discharge home, recommend the following: A lot of help with walking and/or transfers;A little help with bathing/dressing/bathroom;Help with stairs or ramp for entrance;Assistance with cooking/housework   Can travel by private vehicle     Yes  Equipment Recommendations  Rolling walker (2 wheels)    Recommendations for Other Services       Precautions / Restrictions Precautions Precautions:  Fall Restrictions Weight Bearing Restrictions: No     Mobility  Bed Mobility Overal bed mobility: Needs Assistance Bed Mobility: Supine to Sit, Sit to Supine     Supine to sit: Supervision Sit to supine: Contact guard assist   General bed mobility comments: slightly labored movement, increased time    Transfers Overall transfer level: Needs assistance Equipment used: Rolling walker (2 wheels) Transfers: Sit to/from Stand, Bed to chair/wheelchair/BSC Sit to Stand: Min assist   Step pivot transfers: Min assist       General transfer comment: unsteady labored movement requiring frequent verbal/tactile cueing for proper hand placement during sit to stands with fair/poor carryover    Ambulation/Gait Ambulation/Gait assistance: Min assist Gait Distance (Feet): 30 Feet Assistive device: Rolling walker (2 wheels) Gait Pattern/deviations: Decreased step length - right, Decreased step length - left, Decreased stride length, Decreased dorsiflexion - right, Trunk flexed, Decreased stance time - right Gait velocity: decreased     General Gait Details: slow labored cadence with excessive external rotation RLE, no loss of balance, limited mostly due to fatigue and generalized weakness   Stairs             Wheelchair Mobility     Tilt Bed    Modified Rankin (Stroke Patients Only)       Balance Overall balance assessment: Needs assistance Sitting-balance support: Feet supported, No upper extremity supported Sitting balance-Leahy Scale: Fair Sitting balance - Comments: fair/good seated at EOB   Standing balance support: Reliant on assistive device for balance, During functional activity, Bilateral upper extremity supported Standing balance-Leahy Scale: Poor Standing balance comment: fair/poor using RW  Cognition Arousal: Alert Behavior During Therapy: WFL for tasks assessed/performed Overall Cognitive Status: Within  Functional Limits for tasks assessed                                          Exercises      General Comments        Pertinent Vitals/Pain Pain Assessment Pain Assessment: No/denies pain    Home Living                          Prior Function            PT Goals (current goals can now be found in the care plan section) Progress towards PT goals: Progressing toward goals    Frequency    Min 3X/week      PT Plan      Co-evaluation              AM-PAC PT "6 Clicks" Mobility   Outcome Measure  Help needed turning from your back to your side while in a flat bed without using bedrails?: None Help needed moving from lying on your back to sitting on the side of a flat bed without using bedrails?: A Little Help needed moving to and from a bed to a chair (including a wheelchair)?: A Little Help needed standing up from a chair using your arms (e.g., wheelchair or bedside chair)?: A Lot Help needed to walk in hospital room?: A Lot Help needed climbing 3-5 steps with a railing? : A Lot 6 Click Score: 16    End of Session   Activity Tolerance: Patient tolerated treatment well;Patient limited by fatigue Patient left: in chair;with call bell/phone within reach;with family/visitor present Nurse Communication: Mobility status PT Visit Diagnosis: Unsteadiness on feet (R26.81);Muscle weakness (generalized) (M62.81);Other abnormalities of gait and mobility (R26.89)     Time: 4098-1191 PT Time Calculation (min) (ACUTE ONLY): 21 min  Charges:    $Gait Training: 8-22 mins $Therapeutic Activity: 8-22 mins PT General Charges $$ ACUTE PT VISIT: 1 Visit                     10:43 AM, 08/28/23 Ocie Bob, MPT Physical Therapist with [facility] [phone] office [phone] mobile phone

## 2023-08-28 NOTE — Progress Notes (Signed)
Progress Note   Patient: David Herrera XBJ:[id number] DOB: October 18, 1933 DOA: 08/23/2023     0 DOS: the patient was seen and examined on 08/28/2023   Brief Narrative:     89 year old male, reports that he lives alone, ambulates with the help of a cane, states that his son lives within a mile away, PMH of HTN, HLD, DM 2, presented to the ED due to generalized weakness that has been ongoing for the last 2 weeks and since then had progressed to inability to walk.  He sustained a right clavicle fracture about 6 weeks prior, followed up with his orthopedic surgeon on day of ED visit and stable right clavicle shaft fracture was noted.  He was advised to gradually increase activity.   ED Course:  In the emergency department, BP was 115/57, other vital signs were within normal range.  Workup in the ED showed normocytic anemia, BMP showed sodium 131, potassium 3.3, chloride 96, bicarb 23, blood glucose 177, BUN 19, creatinine 0.74.  BNP 80, TSH 1.648, urinalysis was normal, magnesium 1.7 CT of head showed no acute intracranial abnormality Chest x-ray showed no active disease.   Patient ambulated 70 feet with PT on 8/24 and 120 feet on 8/25.  Patient was planned for discharge home with home health services on 8/24, however patient's son insisted that patient was unsafe to live alone, and wanted him to go to SNF for rehab.  His insurance has denied authorization and patient is now appealing.  Assessment and Plan: 1-generalized weakness -Multifactorial; associated with physical disease and deconditioning from advanced age, electrolyte disturbances and dehydration.  Patient also found with B12 deficiency -Continue B12 supplementation (planning for intramuscular injection x 7 for 1 week, then weekly for 1 month and then monthly after that). -Fluid resuscitation and electrolytes are being repleted -Patient is hemodynamically stable for discharge to skilled nursing facility for further care and  rehab -Physical therapy has worked with patient recommending short-term rehabilitation; rolling walker and assistant has been discussed with patient.  2-anemia -Stable -Further workup and evaluation as an outpatient -No overt bleeding appreciated.  3-hypertension -Stable and well-controlled -Continue the use of losartan -Heart healthy diet discussed with patient.  4-hyperlipidemia -Continue statin -Continue heart healthy diet.  5-type 2 diabetes -A1c 6.9 -Continue holding oral hypoglycemic agents while inpatient -Continue sliding scale insulin and modified carbohydrate diet.  6-vitamin B12 deficiency -Aggressive repletion will be initiated with intramuscular injection daily x 7 days; then weekly x 1 month and then monthly after that -Repeat levels in about 8 to 12 weeks with decisions at that time for preferred supplementation route.  7-pressure injury -Multiple stage II injuries without signs of superimposed infection has been appreciated (affecting bilateral thighs and right lateral foot).  Pressure injuries were present at time of admission. -Continue constant repositioning and local care.  Subjective:  Weak, deconditioned and chronically ill in appearance.  Denies chest pain, no nausea, no vomiting.  Improved appetite.  Hemodynamically stable.  Physical Exam: Vitals:   08/27/23 1315 08/27/23 2052 08/28/23 0516 08/28/23 1452  BP: 135/65 132/71 121/67 137/78  Pulse: 80 82 68 71  Resp: 17 20 18 17   Temp: 98.6 F (37 C) 98.2 F (36.8 C) (!) 97.5 F (36.4 C) 98.1 F (36.7 C)  TempSrc: Oral Oral Oral Oral  SpO2: 97% 100% 99% 100%  Weight:       General exam: Alert, awake, following commands appropriately and in no acute distress.  No overnight events. Respiratory system: Clear to auscultation.  Respiratory effort normal.  Good saturation on room air.  No using accessory muscles. Cardiovascular system:RRR. No rubs or gallops; no JVD. Gastrointestinal system: Abdomen is  nondistended, soft and nontender. No organomegaly or masses felt. Normal bowel sounds heard. Central nervous system: Really weak.  No focal neurological deficits. Extremities: No cyanosis or clubbing. Skin: No petechiae.  Multiple pressure injuries appreciated on his thighs bilaterally and right lateral foot aspect.  No signs of superimposed infection.  No active drainage. Psychiatry: Flat affect; no suicidal ideation or hallucinations.  Latest data Reviewed: CBC: White blood cells 4.7, hemoglobin 11.9 and platelet count 2 13K Basic metabolic panel: Sodium 135, potassium 3.8, chloride 103, bicarb 25, BUN 13, creatinine 0.70 Magnesium: 2.2  Family Communication: Son at bedside.  Disposition: Status is: Observation The patient will require care spanning > 2 midnights and should be moved to inpatient because: Physical deconditioning with requirement of short-term rehabilitation placement for safe discharge pathway.   Planned Discharge Destination: Skilled nursing facility   Time spent: 40 minutes  Author: Vassie Loll, MD 08/28/2023 4:53 PM  For on call review [email or web address].

## 2023-08-29 DIAGNOSIS — Z85828 Personal history of other malignant neoplasm of skin: Secondary | ICD-10-CM | POA: Diagnosis not present

## 2023-08-29 DIAGNOSIS — I10 Essential (primary) hypertension: Secondary | ICD-10-CM | POA: Diagnosis not present

## 2023-08-29 DIAGNOSIS — E440 Moderate protein-calorie malnutrition: Secondary | ICD-10-CM | POA: Diagnosis not present

## 2023-08-29 DIAGNOSIS — R531 Weakness: Secondary | ICD-10-CM | POA: Diagnosis not present

## 2023-08-29 DIAGNOSIS — Z79899 Other long term (current) drug therapy: Secondary | ICD-10-CM | POA: Diagnosis not present

## 2023-08-29 DIAGNOSIS — R0602 Shortness of breath: Secondary | ICD-10-CM | POA: Diagnosis not present

## 2023-08-29 DIAGNOSIS — E871 Hypo-osmolality and hyponatremia: Secondary | ICD-10-CM | POA: Diagnosis not present

## 2023-08-29 DIAGNOSIS — Z7982 Long term (current) use of aspirin: Secondary | ICD-10-CM | POA: Diagnosis not present

## 2023-08-29 DIAGNOSIS — L8990 Pressure ulcer of unspecified site, unspecified stage: Secondary | ICD-10-CM | POA: Diagnosis not present

## 2023-08-29 DIAGNOSIS — E876 Hypokalemia: Secondary | ICD-10-CM | POA: Diagnosis not present

## 2023-08-29 DIAGNOSIS — Z7984 Long term (current) use of oral hypoglycemic drugs: Secondary | ICD-10-CM | POA: Diagnosis not present

## 2023-08-29 DIAGNOSIS — Z96641 Presence of right artificial hip joint: Secondary | ICD-10-CM | POA: Diagnosis not present

## 2023-08-29 DIAGNOSIS — E538 Deficiency of other specified B group vitamins: Secondary | ICD-10-CM | POA: Diagnosis not present

## 2023-08-29 DIAGNOSIS — E860 Dehydration: Secondary | ICD-10-CM | POA: Diagnosis not present

## 2023-08-29 DIAGNOSIS — E1165 Type 2 diabetes mellitus with hyperglycemia: Secondary | ICD-10-CM | POA: Diagnosis not present

## 2023-08-29 DIAGNOSIS — E782 Mixed hyperlipidemia: Secondary | ICD-10-CM | POA: Diagnosis not present

## 2023-08-29 LAB — GLUCOSE, CAPILLARY
Glucose-Capillary: 113 mg/dL — ABNORMAL HIGH (ref 70–99)
Glucose-Capillary: 102 mg/dL — ABNORMAL HIGH (ref 70–99)
Glucose-Capillary: 149 mg/dL — ABNORMAL HIGH (ref 70–99)
Glucose-Capillary: 187 mg/dL — ABNORMAL HIGH (ref 70–99)

## 2023-08-29 NOTE — Progress Notes (Signed)
Up in chair for most of shift, tolerated well. Ate 100% of all meals. No events on this shift. Ambulated from chair in room to hallway and back with 1 assist.

## 2023-08-29 NOTE — Progress Notes (Signed)
Progress Note   Patient: David Herrera NFA:[id number] DOB: 1933/11/04 DOA: 08/23/2023     0 DOS: the patient was seen and examined on 08/29/2023   Brief Narrative:     89 year old male, reports that he lives alone, ambulates with the help of a cane, states that his son lives within a mile away, PMH of HTN, HLD, DM 2, presented to the ED due to generalized weakness that has been ongoing for the last 2 weeks and since then had progressed to inability to walk.  He sustained a right clavicle fracture about 6 weeks prior, followed up with his orthopedic surgeon on day of ED visit and stable right clavicle shaft fracture was noted.  He was advised to gradually increase activity.   ED Course:  In the emergency department, BP was 115/57, other vital signs were within normal range.  Workup in the ED showed normocytic anemia, BMP showed sodium 131, potassium 3.3, chloride 96, bicarb 23, blood glucose 177, BUN 19, creatinine 0.74.  BNP 80, TSH 1.648, urinalysis was normal, magnesium 1.7 CT of head showed no acute intracranial abnormality Chest x-ray showed no active disease.   Patient ambulated 70 feet with PT on 8/24 and 120 feet on 8/25.  Patient was planned for discharge home with home health services on 8/24, however patient's son insisted that patient was unsafe to live alone, and wanted him to go to SNF for rehab.  His insurance has denied authorization and patient is now appealing.  Assessment and Plan: 1-generalized weakness -Multifactorial; associated with physical disease and deconditioning from advanced age, electrolyte disturbances and dehydration.  Patient also found with B12 deficiency -Continue B12 supplementation (planning for intramuscular injection x 7 for 1 week, then weekly for 1 month and then monthly after that). -Fluid resuscitation and electrolytes are being repleted -Patient is hemodynamically stable for discharge to skilled nursing facility for further care and  rehab -Physical therapy has worked with patient recommending short-term rehabilitation; rolling walker and assistant has been discussed with patient.  2-anemia -Stable -Further workup and evaluation as an outpatient -No overt bleeding appreciated.  3-hypertension -Stable and well-controlled -Continue the use of losartan -Heart healthy diet discussed with patient.  4-hyperlipidemia -Continue statin -Continue heart healthy diet.  5-type 2 diabetes -A1c 6.9 -Continue holding oral hypoglycemic agents while inpatient -Continue sliding scale insulin and modified carbohydrate diet.  6-vitamin B12 deficiency -Aggressive repletion will be initiated with intramuscular injection daily x 7 days; then weekly x 1 month and then monthly after that -Repeat levels in about 8 to 12 weeks with decisions at that time for preferred supplementation route.  7-pressure injury -Multiple stage II injuries without signs of superimposed infection has been appreciated (affecting bilateral thighs and right lateral foot).  Pressure injuries were present at time of admission. -Continue constant repositioning and local care.  8-moderate protein calorie malnutrition -Continue vitamin C, continue seeing and the use of feeding supplement -Appreciate assistance and recommendation by nutritional service.  Subjective:  Reports an improvement in his appetite; continue to feel weak, deconditioned and requiring assistance with ambulation.  No chest pain, no nausea, no vomiting, no shortness of breath.  Physical Exam: Vitals:   08/28/23 1452 08/28/23 1958 08/29/23 0437 08/29/23 1233  BP: 137/78 114/62 111/60 107/62  Pulse: 71 86 84 87  Resp: 17 17 18 18   Temp: 98.1 F (36.7 C) 97.9 F (36.6 C) 98 F (36.7 C) 98.2 F (36.8 C)  TempSrc: Oral Oral  Oral  SpO2: 100% 99% 99% 100%  Weight:       General exam: Alert, awake, following commands appropriately and oriented x 2 on today's visit.  Overnight nursing  staff reporting sundowning/confusion.  No nausea, no vomiting, no chest pain. Respiratory system: Clear to auscultation. Respiratory effort normal.  Good saturation on room air. Cardiovascular system:RRR. No rub or gallops; no JVD. Gastrointestinal system: Abdomen is nondistended, soft and nontender. No organomegaly or masses felt. Normal bowel sounds heard. Central nervous system: No focal neurological deficits. Extremities: No cyanosis or clubbing. Skin: No petechiae.  Stage II pressure injuries on his thighs bilaterally and also right foot lateral aspect.  No signs of superimposed infection.  No active drainage. Psychiatry: No suicidal ideation or hallucinations; flat affect.  Latest data Reviewed: CBC: White blood cells 4.7, hemoglobin 11.9 and platelet count 2 13K Basic metabolic panel: Sodium 135, potassium 3.8, chloride 103, bicarb 25, BUN 13, creatinine 0.70 Magnesium: 2.2  Family Communication: Son at bedside.  Disposition: Status is: Observation The patient will require care spanning > 2 midnights and should be moved to inpatient because: Physical deconditioning with requirement of short-term rehabilitation placement for safe discharge pathway.   Planned Discharge Destination: Skilled nursing facility  Time spent: 40 minutes  Author: Vassie Loll, MD 08/29/2023 6:21 PM  For on call review [email or web address].

## 2023-08-29 NOTE — Progress Notes (Signed)
Patient confused, woke up trying to get out of the bed. Patient bed soaked in urine. Patient stood at bedside with assistance while this nurse fixed his bed with clean linen. Patient redirected and assisted back in bed. Patient bed alarm on, call light within reach, and lights off.

## 2023-08-29 NOTE — Progress Notes (Signed)
Nutrition Follow-up  DOCUMENTATION CODES:   Non-severe (moderate) malnutrition in context of social or environmental circumstances  INTERVENTION:   -Continue 1 packet Juven BID, each packet provides 95 calories, 2.5 grams of protein (collagen), and 9.8 grams of carbohydrate (3 grams sugar); also contains 7 grams of L-arginine and L-glutamine, 300 mg vitamin C, 15 mg vitamin E, 1.2 mcg vitamin B-12, 9.5 mg zinc, 200 mg calcium, and 1.5 g  Calcium Beta-hydroxy-Beta-methylbutyrate to support wound healing  -Continue MVI with minerals daily -Continue 500 mg vitamin C BID -Continue 220 mg zinc sulfate daily x 14 days  NUTRITION DIAGNOSIS:   Moderate Malnutrition related to social / environmental circumstances as evidenced by mild fat depletion, moderate fat depletion, mild muscle depletion, moderate muscle depletion.  Ongoing  GOAL:   Patient will meet greater than or equal to 90% of their needs  Progressing   MONITOR:   PO intake, Supplement acceptance  REASON FOR ASSESSMENT:   Malnutrition Screening Tool    ASSESSMENT:   Pt with medical history significant of hypertension, hyperlipidemia, T2DM who presents due to generalized weakness that has been ongoing since last 2 weeks which has since progressed to inability to walk.  Reviewed I/O's: -10 ml x 24 hours and -3.3 L since admission  UOP: 250 ml x 24 hours   Case discussed with RN; pt is pleasantly confused. He has a good appetite and is consuming all of his meals and supplements.   Pt lying in recliner chair at time of visit. Pt pleasantly confused and eager to engage this RD in conversation. He reports he has a good appetite and has been eating most of his meals here (potato soup and carrots for lunch). Pt denies any chewing or swallowing issues and enjoys the hospital food; he is able to choose food items that he likes on the carb modified diet.   Pt reports good appetite PTA consuming 3 meals per day which consist of a  meat, starch and vegetable. Pt reports [place] He endorses that he does his own grocery shopping but has family members who lives close by who help prepare his food (nephews, son, and girlfriend). Given pt's confusion, unsure of accuracy of statements.   No new wt since admission. Pt reports he does not think he has lost weight. Reviewed wt hx; wt has been stable over the past several years.   Per TOC notes, awaiting appeal for SNF coverage.   Medications reviewed and include vitamin B-12, vitamin C, and zinc sulfate.   Labs reviewed: CBGS: 102-190 (inpatient orders for glycemic control are 0-15 units insulin aspart TID with meals).    NUTRITION - FOCUSED PHYSICAL EXAM:  Flowsheet Row Most Recent Value  Orbital Region Moderate depletion  Upper Arm Region Mild depletion  Thoracic and Lumbar Region Mild depletion  Buccal Region Mild depletion  Temple Region Mild depletion  Clavicle Bone Region Moderate depletion  Clavicle and Acromion Bone Region Moderate depletion  Scapular Bone Region Moderate depletion  Dorsal Hand Moderate depletion  Patellar Region Mild depletion  Anterior Thigh Region Mild depletion  Posterior Calf Region Mild depletion  Edema (RD Assessment) Mild  Hair Reviewed  Eyes Reviewed  Mouth Reviewed  Skin Reviewed  Nails Reviewed       Diet Order:   Diet Order             Diet heart healthy/carb modified Room service appropriate? Yes; Fluid consistency: Thin  Diet effective now  Diet - low sodium heart healthy           Diet Carb Modified                   EDUCATION NEEDS:   No education needs have been identified at this time  Skin:  Skin Assessment: Skin Integrity Issues: Skin Integrity Issues:: Stage I, Other (Comment), Stage II Stage I: rt thigh, lt thigh Stage II: rt foot Other: IAD perineum  Last BM:  08/29/23 (type 4)  Height:   Ht Readings from Last 1 Encounters:  08/09/23 5' 5.5"  (1.664 m)    Weight:   Wt Readings from Last 1 Encounters:  08/24/23 71.3 kg    Ideal Body Weight:  63.2 kg  BMI:  Body mass index is 25.76 kg/m.  Estimated Nutritional Needs:   Kcal:  1700-1900  Protein:  90-105 grams  Fluid:  > 1.7 L    Levada Schilling, RD, LDN, CDCES Registered Dietitian II Certified Diabetes Care and Education Specialist Please refer to [facility] for RD and/or RD on-call/weekend/after hours pager

## 2023-08-30 DIAGNOSIS — E1165 Type 2 diabetes mellitus with hyperglycemia: Secondary | ICD-10-CM | POA: Diagnosis not present

## 2023-08-30 DIAGNOSIS — E782 Mixed hyperlipidemia: Secondary | ICD-10-CM | POA: Diagnosis not present

## 2023-08-30 DIAGNOSIS — D638 Anemia in other chronic diseases classified elsewhere: Secondary | ICD-10-CM | POA: Diagnosis not present

## 2023-08-30 DIAGNOSIS — I10 Essential (primary) hypertension: Secondary | ICD-10-CM | POA: Diagnosis not present

## 2023-08-30 DIAGNOSIS — R0602 Shortness of breath: Secondary | ICD-10-CM | POA: Diagnosis not present

## 2023-08-30 DIAGNOSIS — Z7982 Long term (current) use of aspirin: Secondary | ICD-10-CM | POA: Diagnosis not present

## 2023-08-30 DIAGNOSIS — Z85828 Personal history of other malignant neoplasm of skin: Secondary | ICD-10-CM | POA: Diagnosis not present

## 2023-08-30 DIAGNOSIS — Z79899 Other long term (current) drug therapy: Secondary | ICD-10-CM | POA: Diagnosis not present

## 2023-08-30 DIAGNOSIS — L8990 Pressure ulcer of unspecified site, unspecified stage: Secondary | ICD-10-CM | POA: Diagnosis not present

## 2023-08-30 DIAGNOSIS — E538 Deficiency of other specified B group vitamins: Secondary | ICD-10-CM | POA: Diagnosis not present

## 2023-08-30 DIAGNOSIS — R531 Weakness: Secondary | ICD-10-CM | POA: Diagnosis not present

## 2023-08-30 DIAGNOSIS — E871 Hypo-osmolality and hyponatremia: Secondary | ICD-10-CM | POA: Diagnosis not present

## 2023-08-30 DIAGNOSIS — Z7984 Long term (current) use of oral hypoglycemic drugs: Secondary | ICD-10-CM | POA: Diagnosis not present

## 2023-08-30 DIAGNOSIS — E440 Moderate protein-calorie malnutrition: Secondary | ICD-10-CM | POA: Diagnosis not present

## 2023-08-30 DIAGNOSIS — E860 Dehydration: Secondary | ICD-10-CM | POA: Diagnosis not present

## 2023-08-30 DIAGNOSIS — Z96641 Presence of right artificial hip joint: Secondary | ICD-10-CM | POA: Diagnosis not present

## 2023-08-30 DIAGNOSIS — E876 Hypokalemia: Secondary | ICD-10-CM | POA: Diagnosis not present

## 2023-08-30 LAB — GLUCOSE, CAPILLARY
Glucose-Capillary: 145 mg/dL — ABNORMAL HIGH (ref 70–99)
Glucose-Capillary: 143 mg/dL — ABNORMAL HIGH (ref 70–99)
Glucose-Capillary: 114 mg/dL — ABNORMAL HIGH (ref 70–99)
Glucose-Capillary: 170 mg/dL — ABNORMAL HIGH (ref 70–99)

## 2023-08-30 NOTE — Progress Notes (Signed)
Up in chair and ambulated in hall for short distance and tolerated both well. No events this shift.

## 2023-08-30 NOTE — Progress Notes (Signed)
Mobility Specialist Progress Note:    08/30/23 1345  Mobility  Activity Ambulated with assistance in hallway  Level of Assistance Moderate assist, patient does 50-74%  Assistive Device Front wheel walker  Distance Ambulated (ft) 100 ft  Range of Motion/Exercises Active;All extremities  Activity Response Tolerated well  Mobility Referral Yes  $Mobility charge 1 Mobility  Mobility Specialist Start Time (ACUTE ONLY) 1345  Mobility Specialist Stop Time (ACUTE ONLY) 1355  Mobility Specialist Time Calculation (min) (ACUTE ONLY) 10 min   Pt received in chair, agreeable to mobility session. Required ModA to stand and MinA to ambulate with RW. Tolerated well, audible SOB at EOS. Returned pt to chair, alarm on. All needs met.   [name] Mobility Specialist Please contact via Special educational needs teacher or  Rehab office at [phone]

## 2023-08-30 NOTE — Progress Notes (Signed)
Pt alert and oriented x 2. Desitin and a sacral foam applied to pt. Pt ambulated in room this shift. Pt slept through the night. Vitals stable, no c/o pain verbalized.

## 2023-08-30 NOTE — Progress Notes (Signed)
Progress Note   Patient: David Herrera [id number]:[id number] DOB: [birth year] DOA: 08/23/2023     0 DOS: the patient was seen and examined on 08/30/2023   Brief Narrative:     89 year old male, reports that he lives alone, ambulates with the help of a cane, states that his son lives within a mile away, PMH of HTN, HLD, DM 2, presented to the ED due to generalized weakness that has been ongoing for the last 2 weeks and since then had progressed to inability to walk.  He sustained a right clavicle fracture about 6 weeks prior, followed up with his orthopedic surgeon on day of ED visit and stable right clavicle shaft fracture was noted.  He was advised to gradually increase activity.   ED Course:  In the emergency department, BP was 115/57, other vital signs were within normal range.  Workup in the ED showed normocytic anemia, BMP showed sodium 131, potassium 3.3, chloride 96, bicarb 23, blood glucose 177, BUN 19, creatinine 0.74.  BNP 80, TSH 1.648, urinalysis was normal, magnesium 1.7 CT of head showed no acute intracranial abnormality Chest x-ray showed no active disease.   Patient ambulated 70 feet with PT on 8/24 and 120 feet on 8/25.  Patient was planned for discharge home with home health services on 8/24, however patient's son insisted that patient was unsafe to live alone, and wanted him to go to SNF for rehab.  His insurance has denied authorization and patient is now appealing.  Assessment and Plan: 1-generalized weakness -Multifactorial; associated with physical disease and deconditioning from advanced age, electrolyte disturbances and dehydration.  Patient also found with B12 deficiency -Continue B12 supplementation (planning for intramuscular injection x 7 for 1 week, then weekly for 1 month and then monthly after that). -Fluid resuscitation and electrolytes are being repleted -Patient is hemodynamically stable for discharge to skilled nursing facility for further care and  rehab -Physical therapy has worked with patient recommending short-term rehabilitation; rolling walker and assistant has been discussed with patient.  2-anemia -Stable -Further workup and evaluation as an outpatient -No overt bleeding appreciated.  3-hypertension -Stable and well-controlled -Continue the use of losartan -Heart healthy diet discussed with patient.  4-hyperlipidemia -Continue statin -Continue heart healthy diet.  5-type 2 diabetes -A1c 6.9 -Continue holding oral hypoglycemic agents while inpatient -Continue sliding scale insulin and modified carbohydrate diet.  6-vitamin B12 deficiency -Aggressive repletion will be initiated with intramuscular injection daily x 7 days; then weekly x 1 month and then monthly after that -Repeat levels in about 8 to 12 weeks with decisions at that time for preferred supplementation route.  7-pressure injury -Multiple stage II injuries without signs of superimposed infection has been appreciated (affecting bilateral thighs and right lateral foot).  Pressure injuries were present at time of admission. -Continue constant repositioning and local care.  8-moderate protein calorie malnutrition -Continue vitamin C, continue seeing and the use of feeding supplement -Appreciate assistance and recommendation by nutritional service.  Subjective:  In no acute distress.  No overnight events.  Patient is afebrile; no chest pain, no nausea, no vomiting.  Chronically ill and deconditioned on exam.  Physical Exam: Vitals:   08/29/23 1233 08/29/23 2017 08/30/23 0510 08/30/23 1434  BP: 107/62 110/69 (!) 117/55 93/64  Pulse: 87 77 67 79  Resp: 18 18 18 20   Temp: 98.2 F (36.8 C) 98 F (36.7 C) 98 F (36.7 C) 97.7 F (36.5 C)  TempSrc: Oral   Oral  SpO2: 100% 100% 100% 100%  Weight:       General exam: Alert, awake, following commands appropriately and in no acute distress; continue to demonstrate generalized weakness and  deconditioning. Respiratory system: Clear to auscultation. Respiratory effort normal.  No using accessory muscles. Cardiovascular system:RRR. No rubs or gallops; no JVD. Gastrointestinal system: Abdomen is nondistended, soft and nontender. No organomegaly or masses felt. Normal bowel sounds heard. Central nervous system: Alert and oriented. No focal neurological deficits. Extremities: No cyanosis or clubbing. Skin: No petechiae; stage II pressure injury on his thighs bilaterally and also lateral aspect of his right foot.  No signs of superimposed infection or active drainage appreciated. Psychiatry: Judgement and insight appear normal.  No suicidal ideation or hallucination.  Latest data Reviewed: CBC: White blood cells 4.7, hemoglobin 11.9 and platelet count 2 13K Basic metabolic panel: Sodium 135, potassium 3.8, chloride 103, bicarb 25, BUN 13, creatinine 0.70 Magnesium: 2.2  Family Communication: Son at bedside.  Disposition: Status is: Observation The patient will require care spanning > 2 midnights and should be moved to inpatient because: Physical deconditioning with requirement of short-term rehabilitation placement for safe discharge pathway.   Planned Discharge Destination: Skilled nursing facility  Time spent: 40 minutes  Author: Vassie Loll, MD 08/30/2023 5:46 PM  For on call review [email or web address].

## 2023-08-31 DIAGNOSIS — Z79899 Other long term (current) drug therapy: Secondary | ICD-10-CM | POA: Diagnosis not present

## 2023-08-31 DIAGNOSIS — E782 Mixed hyperlipidemia: Secondary | ICD-10-CM | POA: Diagnosis not present

## 2023-08-31 DIAGNOSIS — Z7982 Long term (current) use of aspirin: Secondary | ICD-10-CM | POA: Diagnosis not present

## 2023-08-31 DIAGNOSIS — R0602 Shortness of breath: Secondary | ICD-10-CM | POA: Diagnosis not present

## 2023-08-31 DIAGNOSIS — E1165 Type 2 diabetes mellitus with hyperglycemia: Secondary | ICD-10-CM | POA: Diagnosis not present

## 2023-08-31 DIAGNOSIS — E538 Deficiency of other specified B group vitamins: Secondary | ICD-10-CM | POA: Diagnosis not present

## 2023-08-31 DIAGNOSIS — E860 Dehydration: Secondary | ICD-10-CM | POA: Diagnosis not present

## 2023-08-31 DIAGNOSIS — Z96641 Presence of right artificial hip joint: Secondary | ICD-10-CM | POA: Diagnosis not present

## 2023-08-31 DIAGNOSIS — Z7984 Long term (current) use of oral hypoglycemic drugs: Secondary | ICD-10-CM | POA: Diagnosis not present

## 2023-08-31 DIAGNOSIS — Z85828 Personal history of other malignant neoplasm of skin: Secondary | ICD-10-CM | POA: Diagnosis not present

## 2023-08-31 DIAGNOSIS — E876 Hypokalemia: Secondary | ICD-10-CM | POA: Diagnosis not present

## 2023-08-31 DIAGNOSIS — R531 Weakness: Secondary | ICD-10-CM | POA: Diagnosis not present

## 2023-08-31 DIAGNOSIS — I10 Essential (primary) hypertension: Secondary | ICD-10-CM | POA: Diagnosis not present

## 2023-08-31 DIAGNOSIS — E440 Moderate protein-calorie malnutrition: Secondary | ICD-10-CM | POA: Diagnosis not present

## 2023-08-31 DIAGNOSIS — E871 Hypo-osmolality and hyponatremia: Secondary | ICD-10-CM | POA: Diagnosis not present

## 2023-08-31 DIAGNOSIS — L8990 Pressure ulcer of unspecified site, unspecified stage: Secondary | ICD-10-CM | POA: Diagnosis not present

## 2023-08-31 LAB — GLUCOSE, CAPILLARY
Glucose-Capillary: 152 mg/dL — ABNORMAL HIGH (ref 70–99)
Glucose-Capillary: 86 mg/dL (ref 70–99)
Glucose-Capillary: 109 mg/dL — ABNORMAL HIGH (ref 70–99)
Glucose-Capillary: 136 mg/dL — ABNORMAL HIGH (ref 70–99)

## 2023-08-31 LAB — CREATININE, SERUM
Creatinine, Ser: 0.71 mg/dL (ref 0.61–1.24)
GFR, Estimated: >60 mL/min (ref >60)

## 2023-08-31 NOTE — Progress Notes (Signed)
Progress Note   Patient: David Herrera YNW:[id number] DOB: 1933/05/26 DOA: 08/23/2023     0 DOS: the patient was seen and examined on 08/31/2023   Brief Narrative:     89 year old male, reports that he lives alone, ambulates with the help of a cane, states that his son lives within a mile away, PMH of HTN, HLD, DM 2, presented to the ED due to generalized weakness that has been ongoing for the last 2 weeks and since then had progressed to inability to walk.  He sustained a right clavicle fracture about 6 weeks prior, followed up with his orthopedic surgeon on day of ED visit and stable right clavicle shaft fracture was noted.  He was advised to gradually increase activity.   ED Course:  In the emergency department, BP was 115/57, other vital signs were within normal range.  Workup in the ED showed normocytic anemia, BMP showed sodium 131, potassium 3.3, chloride 96, bicarb 23, blood glucose 177, BUN 19, creatinine 0.74.  BNP 80, TSH 1.648, urinalysis was normal, magnesium 1.7 CT of head showed no acute intracranial abnormality Chest x-ray showed no active disease.   Patient ambulated 70 feet with PT on 8/24 and 120 feet on 8/25.  Patient was planned for discharge home with home health services on 8/24, however patient's son insisted that patient was unsafe to live alone, and wanted him to go to SNF for rehab.  His insurance has denied authorization and patient is now appealing.  Appeal is still pending.  Assessment and Plan: 1-generalized weakness -Multifactorial; associated with physical disease and deconditioning from advanced age, electrolyte disturbances and dehydration.  Patient also found with B12 deficiency -Continue B12 supplementation (planning for intramuscular injection x 7 for 1 week, then weekly for 1 month and then monthly after that). -Fluid resuscitation and electrolytes are being repleted -Patient is hemodynamically stable for discharge to skilled nursing facility for  further care and rehab -Physical therapy has worked with patient recommending short-term rehabilitation; rolling walker and assistant has been discussed with patient.  2-anemia -Stable -Further workup and evaluation as an outpatient -No overt bleeding appreciated.  3-hypertension -Stable and well-controlled -Continue the use of losartan -Heart healthy diet discussed with patient.  4-hyperlipidemia -Continue statin -Continue heart healthy diet.  5-type 2 diabetes -A1c 6.9 -Continue holding oral hypoglycemic agents while inpatient -Continue sliding scale insulin and modified carbohydrate diet.  6-vitamin B12 deficiency -Aggressive repletion will be initiated with intramuscular injection daily x 7 days; then weekly x 1 month and then monthly after that -Repeat levels in about 8 to 12 weeks with decisions at that time for preferred supplementation route.  7-pressure injury -Multiple stage II injuries without signs of superimposed infection has been appreciated (affecting bilateral thighs and right lateral foot).  Pressure injuries were present at time of admission. -Continue constant repositioning and local care.  8-moderate protein calorie malnutrition -Continue vitamin C, continue seeing and the use of feeding supplement -Appreciate assistance and recommendation by nutritional service.  Subjective:  In no acute distress.  No overnight events.  Hemodynamically stable.  Expressing feeling weak and deconditioned.  No chest pain, no nausea, no vomiting.  Patient's appeal is still pending.  Physical Exam: Vitals:   08/30/23 2055 08/31/23 0406 08/31/23 0907 08/31/23 1419  BP: 115/62 116/64 (!) 102/52 137/61  Pulse: 70 65 72 81  Resp: 18 19 18 14   Temp: 98.1 F (36.7 C) 98 F (36.7 C) 98 F (36.7 C) (!) 97.5 F (36.4 C)  TempSrc:  Oral  Oral   SpO2: 98% 99% 99% 100%  Weight:       General exam: Alert, awake, following commands appropriately and expressing no chest pain or  shortness of breath.  Feeling weak and deconditioned. Respiratory system: Clear to auscultation. Respiratory effort normal.  Good saturation on room air. Cardiovascular system:RRR. No rubs or gallops; no JVD. Gastrointestinal system: Abdomen is nondistended, soft and nontender. No organomegaly or masses felt. Normal bowel sounds heard. Central nervous system: Alert and oriented. No focal neurological deficits. Extremities: No cyanosis or clubbing. Skin: No petechiae; stage II pressure injury on his thighs bilaterally and also lateral aspect of his right foot.  No signs of superimposed infection appreciated.  Patient expressed no significant pain. Psychiatry: Judgement and insight appear normal.  No suicidal ideation or hallucinations.  Latest data Reviewed: CBC: White blood cells 4.7, hemoglobin 11.9 and platelet count 2 13K Basic metabolic panel: Sodium 135, potassium 3.8, chloride 103, bicarb 25, BUN 13, creatinine 0.70 Magnesium: 2.2  Family Communication: Significant other at bedside.  Disposition: Status is: Observation The patient will require care spanning > 2 midnights and should be moved to inpatient because: Physical deconditioning with requirement of short-term rehabilitation placement for safe discharge pathway.   Planned Discharge Destination: Skilled nursing facility  Time spent: 40 minutes  Author: Vassie Loll, MD 08/31/2023 2:45 PM  For on call review [email or web address].

## 2023-08-31 NOTE — TOC Progression Note (Signed)
Transition of Care [facility]) - Progression Note    Patient Details  Name: David Herrera MRN: [id number] Date of Birth: 1933-07-23  Transition of Care [facility]) CM/SW Contact  Annice Needy, LCSW Phone Number: 08/31/2023, 10:52 AM  Clinical Narrative:    Spoke with Noralee Stain, Zollie Beckers, patient's appeal is still pending.      Barriers to Discharge: Continued Medical Work up  Expected Discharge Plan and Services         Expected Discharge Date: 08/24/23                                     Social Determinants of Health (SDOH) Interventions SDOH Screenings   Food Insecurity: No Food Insecurity (08/24/2023)  Housing: Low Risk  (08/24/2023)  Transportation Needs: No Transportation Needs (08/24/2023)  Utilities: Not At Risk (08/24/2023)  Tobacco Use: Low Risk  (08/23/2023)    Readmission Risk Interventions     No data to display

## 2023-08-31 NOTE — Plan of Care (Signed)
  Problem: Education: Goal: Knowledge of General Education information will improve Description: Including pain rating scale, medication(s)/side effects and non-pharmacologic comfort measures Outcome: Progressing   Problem: Health Behavior/Discharge Planning: Goal: Ability to manage health-related needs will improve Outcome: Progressing   Problem: Clinical Measurements: Goal: Ability to maintain clinical measurements within normal limits will improve Outcome: Progressing   Problem: Clinical Measurements: Goal: Ability to maintain clinical measurements within normal limits will improve Outcome: Progressing Goal: Will remain free from infection Outcome: Progressing   Problem: Nutrition: Goal: Adequate nutrition will be maintained Outcome: Progressing   Problem: Elimination: Goal: Will not experience complications related to bowel motility Outcome: Progressing Goal: Will not experience complications related to urinary retention Outcome: Progressing   Problem: Safety: Goal: Ability to remain free from injury will improve Outcome: Progressing   Problem: Skin Integrity: Goal: Risk for impaired skin integrity will decrease Outcome: Progressing

## 2023-09-01 DIAGNOSIS — Z7982 Long term (current) use of aspirin: Secondary | ICD-10-CM | POA: Diagnosis not present

## 2023-09-01 DIAGNOSIS — E860 Dehydration: Secondary | ICD-10-CM | POA: Diagnosis not present

## 2023-09-01 DIAGNOSIS — Z85828 Personal history of other malignant neoplasm of skin: Secondary | ICD-10-CM | POA: Diagnosis not present

## 2023-09-01 DIAGNOSIS — E876 Hypokalemia: Secondary | ICD-10-CM | POA: Diagnosis not present

## 2023-09-01 DIAGNOSIS — E871 Hypo-osmolality and hyponatremia: Secondary | ICD-10-CM | POA: Diagnosis not present

## 2023-09-01 DIAGNOSIS — E1165 Type 2 diabetes mellitus with hyperglycemia: Secondary | ICD-10-CM | POA: Diagnosis not present

## 2023-09-01 DIAGNOSIS — R0602 Shortness of breath: Secondary | ICD-10-CM | POA: Diagnosis not present

## 2023-09-01 DIAGNOSIS — L8990 Pressure ulcer of unspecified site, unspecified stage: Secondary | ICD-10-CM | POA: Diagnosis not present

## 2023-09-01 DIAGNOSIS — E538 Deficiency of other specified B group vitamins: Secondary | ICD-10-CM | POA: Diagnosis not present

## 2023-09-01 DIAGNOSIS — Z7984 Long term (current) use of oral hypoglycemic drugs: Secondary | ICD-10-CM | POA: Diagnosis not present

## 2023-09-01 DIAGNOSIS — E440 Moderate protein-calorie malnutrition: Secondary | ICD-10-CM | POA: Diagnosis not present

## 2023-09-01 DIAGNOSIS — Z96641 Presence of right artificial hip joint: Secondary | ICD-10-CM | POA: Diagnosis not present

## 2023-09-01 DIAGNOSIS — R531 Weakness: Secondary | ICD-10-CM | POA: Diagnosis not present

## 2023-09-01 DIAGNOSIS — Z79899 Other long term (current) drug therapy: Secondary | ICD-10-CM | POA: Diagnosis not present

## 2023-09-01 DIAGNOSIS — I10 Essential (primary) hypertension: Secondary | ICD-10-CM | POA: Diagnosis not present

## 2023-09-01 DIAGNOSIS — E782 Mixed hyperlipidemia: Secondary | ICD-10-CM | POA: Diagnosis not present

## 2023-09-01 LAB — GLUCOSE, CAPILLARY
Glucose-Capillary: 131 mg/dL — ABNORMAL HIGH (ref 70–99)
Glucose-Capillary: 119 mg/dL — ABNORMAL HIGH (ref 70–99)
Glucose-Capillary: 125 mg/dL — ABNORMAL HIGH (ref 70–99)

## 2023-09-01 NOTE — Progress Notes (Signed)
Progress Note   Patient: David Herrera ZOX:[id number] DOB: [birth year] DOA: 08/23/2023     0 DOS: the patient was seen and examined on 09/01/2023   Brief Narrative:     89 year old male, reports that he lives alone, ambulates with the help of a cane, states that his son lives within a mile away, PMH of HTN, HLD, DM 2, presented to the ED due to generalized weakness that has been ongoing for the last 2 weeks and since then had progressed to inability to walk.  He sustained a right clavicle fracture about 6 weeks prior, followed up with his orthopedic surgeon on day of ED visit and stable right clavicle shaft fracture was noted.  He was advised to gradually increase activity.   ED Course:  In the emergency department, BP was 115/57, other vital signs were within normal range.  Workup in the ED showed normocytic anemia, BMP showed sodium 131, potassium 3.3, chloride 96, bicarb 23, blood glucose 177, BUN 19, creatinine 0.74.  BNP 80, TSH 1.648, urinalysis was normal, magnesium 1.7 CT of head showed no acute intracranial abnormality Chest x-ray showed no active disease.   Patient ambulated 70 feet with PT on 8/24 and 120 feet on 8/25.  Patient was planned for discharge home with home health services on 8/24, however patient's son insisted that patient was unsafe to live alone, and wanted him to go to SNF for rehab.  His insurance has denied authorization and patient is now appealing.  Appeal is still pending.  Assessment and Plan: 1-generalized weakness -Multifactorial; associated with physical disease and deconditioning from advanced age, electrolyte disturbances and dehydration.  Patient also found with B12 deficiency -Continue B12 supplementation (planning for intramuscular injection x 7 for 1 week, then weekly for 1 month and then monthly after that). -Fluid resuscitation and electrolytes are being repleted -Patient is hemodynamically stable for discharge to skilled nursing facility for further  care and rehab -Physical therapy has worked with patient recommending short-term rehabilitation; rolling walker and assistance with ambulation has been discussed with patient.  2-anemia -Stable -Further workup and evaluation as an outpatient -No overt bleeding appreciated.  3-hypertension -Stable and well-controlled -Continue the use of losartan -Heart healthy diet discussed with patient.  4-hyperlipidemia -Continue statin -Continue heart healthy diet.  5-type 2 diabetes -A1c 6.9 -Continue holding oral hypoglycemic agents while inpatient -Continue sliding scale insulin and modified carbohydrate diet.  6-vitamin B12 deficiency -Aggressive repletion will be initiated with intramuscular injection daily x 7 days; then weekly x 1 month and then monthly after that -Repeat levels in about 8 to 12 weeks with decisions at that time for preferred supplementation route.  7-pressure injury -Multiple stage II injuries without signs of superimposed infection has been appreciated (affecting bilateral thighs and right lateral foot).  Pressure injuries were present at time of admission. -Continue constant repositioning and local care.  8-moderate protein calorie malnutrition -Continue vitamin C, continue seeing and the use of feeding supplement -Appreciate assistance and recommendation by nutritional service.  Subjective:  In no acute distress; no overnight events.  Patient has remained hemodynamically stable.  Physical Exam: Vitals:   08/31/23 1419 08/31/23 2124 09/01/23 0502 09/01/23 1318  BP: 137/61 122/73 100/60 96/64  Pulse: 81 70 95 99  Resp: 14  18 18   Temp: (!) 97.5 F (36.4 C) 97.9 F (36.6 C) 97.8 F (36.6 C) 98.6 F (37 C)  TempSrc:    Oral  SpO2: 100% 99% 100% 96%  Weight:  General exam: Following commands appropriately; no acute distress.  Feeling weak, deconditioned and off balance. Respiratory system: Clear to auscultation. Respiratory effort normal.  Good  saturation on room air. Cardiovascular system:RRR. No rubs or gallops. Gastrointestinal system: Abdomen is nondistended, soft and nontender. No organomegaly or masses felt. Normal bowel sounds heard. Central nervous system: Generally weak.  No focal neurological deficits. Extremities: No cyanosis or clubbing. Skin: No petechiae; stage II pressure injuries unchanged from previous exam.  No signs of superimposed infection. Psychiatry: Judgement and insight appear normal.  No hallucinations.  Latest data Reviewed: CBC: White blood cells 4.7, hemoglobin 11.9 and platelet count 2 13K Basic metabolic panel: Sodium 135, potassium 3.8, chloride 103, bicarb 25, BUN 13, creatinine 0.70 Magnesium: 2.2  Family Communication: Significant other at bedside.  Disposition: Status is: Observation The patient will require care spanning > 2 midnights and should be moved to inpatient because: Physical deconditioning with requirement of short-term rehabilitation placement for safe discharge pathway.   Planned Discharge Destination: Skilled nursing facility  Time spent: 40 minutes  Author: Vassie Loll, MD 09/01/2023 5:10 PM  For on call review [email or web address].

## 2023-09-02 DIAGNOSIS — E876 Hypokalemia: Secondary | ICD-10-CM | POA: Diagnosis not present

## 2023-09-02 DIAGNOSIS — E1165 Type 2 diabetes mellitus with hyperglycemia: Secondary | ICD-10-CM | POA: Diagnosis not present

## 2023-09-02 DIAGNOSIS — Z7984 Long term (current) use of oral hypoglycemic drugs: Secondary | ICD-10-CM | POA: Diagnosis not present

## 2023-09-02 DIAGNOSIS — R531 Weakness: Secondary | ICD-10-CM | POA: Diagnosis not present

## 2023-09-02 DIAGNOSIS — L8990 Pressure ulcer of unspecified site, unspecified stage: Secondary | ICD-10-CM | POA: Diagnosis not present

## 2023-09-02 DIAGNOSIS — E782 Mixed hyperlipidemia: Secondary | ICD-10-CM | POA: Diagnosis not present

## 2023-09-02 DIAGNOSIS — Z7982 Long term (current) use of aspirin: Secondary | ICD-10-CM | POA: Diagnosis not present

## 2023-09-02 DIAGNOSIS — Z79899 Other long term (current) drug therapy: Secondary | ICD-10-CM | POA: Diagnosis not present

## 2023-09-02 DIAGNOSIS — E871 Hypo-osmolality and hyponatremia: Secondary | ICD-10-CM | POA: Diagnosis not present

## 2023-09-02 DIAGNOSIS — I10 Essential (primary) hypertension: Secondary | ICD-10-CM | POA: Diagnosis not present

## 2023-09-02 DIAGNOSIS — R0602 Shortness of breath: Secondary | ICD-10-CM | POA: Diagnosis not present

## 2023-09-02 DIAGNOSIS — E860 Dehydration: Secondary | ICD-10-CM | POA: Diagnosis not present

## 2023-09-02 DIAGNOSIS — Z96641 Presence of right artificial hip joint: Secondary | ICD-10-CM | POA: Diagnosis not present

## 2023-09-02 DIAGNOSIS — Z85828 Personal history of other malignant neoplasm of skin: Secondary | ICD-10-CM | POA: Diagnosis not present

## 2023-09-02 DIAGNOSIS — E538 Deficiency of other specified B group vitamins: Secondary | ICD-10-CM | POA: Diagnosis not present

## 2023-09-02 DIAGNOSIS — E440 Moderate protein-calorie malnutrition: Secondary | ICD-10-CM | POA: Diagnosis not present

## 2023-09-02 LAB — GLUCOSE, CAPILLARY
Glucose-Capillary: 109 mg/dL — ABNORMAL HIGH (ref 70–99)
Glucose-Capillary: 145 mg/dL — ABNORMAL HIGH (ref 70–99)
Glucose-Capillary: 92 mg/dL (ref 70–99)
Glucose-Capillary: 237 mg/dL — ABNORMAL HIGH (ref 70–99)

## 2023-09-02 NOTE — Progress Notes (Signed)
Pt has been in chair all shift. Pt ambulated with assistance to restroom multiple times. No c/o pain or discomfort at this time.

## 2023-09-02 NOTE — TOC Progression Note (Signed)
Transition of Care [facility]) - Progression Note    Patient Details  Name: David Herrera MRN: [id number] Date of Birth: Oct 16, 1933  Transition of Care [facility]) CM/SW Contact  Karn Cassis, [place] Phone Number: 09/02/2023, 3:19 PM  Clinical Narrative:  Per Nav[employer] portal, SNF denial has been overturned and pt has SNF auth. Discussed with Eunice Blase at [facility] and will plan on d/c in AM due to late auth. Pt's son and MD updated. TOC will follow up in AM.        Barriers to Discharge: Insurance Authorization  Expected Discharge Plan and Services         Expected Discharge Date: 08/24/23                                     Social Determinants of Health (SDOH) Interventions SDOH Screenings   Food Insecurity: No Food Insecurity (08/24/2023)  Housing: Low Risk  (08/24/2023)  Transportation Needs: No Transportation Needs (08/24/2023)  Utilities: Not At Risk (08/24/2023)  Tobacco Use: Low Risk  (08/23/2023)    Readmission Risk Interventions     No data to display

## 2023-09-02 NOTE — Progress Notes (Signed)
Physical Therapy Treatment Patient Details Name: David Herrera MRN: [id number] DOB: 1933/09/21 Today's Date: 09/02/2023   History of Present Illness David Herrera is an 89 y.o. male with medical history significant of hypertension, hyperlipidemia, T2DM who presents to the emergency department due to generalized weakness that has been ongoing since last 2 weeks which has since progressed to inability to walk.  He uses a cane at baseline.  Patient sustained right clavicle fracture about 6 weeks ago, he followed up with his orthopedic surgeon today and stable right clavicle shaft fracture was noted.  He was advised to gradually increase activity.  Patient denies fever, chills, falls, decrease in appetite, depression.    PT Comments  Pt up in chair upon arrival requesting assistance to commode to complete BM.  Pt required min assist to stand safely and used RW to bathroom.  Pt able to complete hygiene with CGA of therapist to steady self.  Pt then ambulated 150 feet with RW without noted fatigue, however did visualize LE weakness.  Pt returned to chair with son present in room.     If plan is discharge home, recommend the following: A lot of help with walking and/or transfers;A little help with bathing/dressing/bathroom;Help with stairs or ramp for entrance;Assistance with cooking/housework   Can travel by private vehicle     Yes  Equipment Recommendations  Rolling walker (2 wheels)           Mobility  Bed Mobility Overal bed mobility: Needs Assistance         Sit to supine: Contact guard assist   General bed mobility comments: pt was already up in chair today    Transfers Overall transfer level: Needs assistance Equipment used: Rolling walker (2 wheels) Transfers: Sit to/from Stand, Bed to chair/wheelchair/BSC Sit to Stand: Min assist   Step pivot transfers: Min assist       General transfer comment: improved stabiliy, hyperextension in LT knee, weakness     Ambulation/Gait Ambulation/Gait assistance: Min assist, Contact guard assist Gait Distance (Feet): 150 Feet Assistive device: Rolling walker (2 wheels) Gait Pattern/deviations: Decreased step length - right, Decreased step length - left, Decreased stride length, Decreased dorsiflexion - right, Trunk flexed, Decreased stance time - right Gait velocity: decreased     General Gait Details: slow labored cadence with excessive external rotation RLE, no loss of balance, limited mostly due to fatigue and generalized weakness   Stairs not tested                Balance Overall balance assessment: Needs assistance Sitting-balance support: Feet supported, No upper extremity supported Sitting balance-Leahy Scale: Fair Sitting balance - Comments: fair/good seated at EOB   Standing balance support: Reliant on assistive device for balance, During functional activity, Bilateral upper extremity supported Standing balance-Leahy Scale: Poor Standing balance comment: fair/poor using RW                            Cognition Arousal: Alert Behavior During Therapy: WFL for tasks assessed/performed Overall Cognitive Status: Within Functional Limits for tasks assessed                                                 Pertinent Vitals/Pain Pain Assessment Pain Assessment: No/denies pain    PT Goals (current goals can now be found  in the care plan section)      Frequency    Min 3X/week      PT Plan      Co-evaluation              AM-PAC PT "6 Clicks" Mobility   Outcome Measure  Help needed turning from your back to your side while in a flat bed without using bedrails?: None Help needed moving from lying on your back to sitting on the side of a flat bed without using bedrails?: A Little Help needed moving to and from a bed to a chair (including a wheelchair)?: A Little Help needed standing up from a chair using your arms (e.g., wheelchair or  bedside chair)?: A Lot Help needed to walk in hospital room?: A Little Help needed climbing 3-5 steps with a railing? : A Lot 6 Click Score: 17    End of Session Equipment Utilized During Treatment: Gait belt Activity Tolerance: Patient tolerated treatment well;Patient limited by fatigue Patient left: in chair;with call bell/phone within reach;with family/visitor present Nurse Communication: Mobility status PT Visit Diagnosis: Unsteadiness on feet (R26.81);Muscle weakness (generalized) (M62.81);Other abnormalities of gait and mobility (R26.89)     Time: 0946-1000 PT Time Calculation (min) (ACUTE ONLY): 14 min  Charges:    $Gait Training: 8-22 mins PT General Charges $$ ACUTE PT VISIT: 1 Visit                    Lurena Nida, PTA/CLT [facility] Ph: [phone]    Lurena Nida 09/02/2023, 1:37 PM

## 2023-09-02 NOTE — Progress Notes (Signed)
Progress Note   Patient: David Herrera WUJ:[id number] DOB: 22-Nov-1933 DOA: 08/23/2023     0 DOS: the patient was seen and examined on 09/02/2023   Brief Narrative:     89 year old male, reports that he lives alone, ambulates with the help of a cane, states that his son lives within a mile away, PMH of HTN, HLD, DM 2, presented to the ED due to generalized weakness that has been ongoing for the last 2 weeks and since then had progressed to inability to walk.  He sustained a right clavicle fracture about 6 weeks prior, followed up with his orthopedic surgeon on day of ED visit and stable right clavicle shaft fracture was noted.  He was advised to gradually increase activity.   ED Course:  In the emergency department, BP was 115/57, other vital signs were within normal range.  Workup in the ED showed normocytic anemia, BMP showed sodium 131, potassium 3.3, chloride 96, bicarb 23, blood glucose 177, BUN 19, creatinine 0.74.  BNP 80, TSH 1.648, urinalysis was normal, magnesium 1.7 CT of head showed no acute intracranial abnormality Chest x-ray showed no active disease.   Patient ambulated 70 feet with PT on 8/24 and 120 feet on 8/25.  Patient was planned for discharge home with home health services on 8/24, however patient's son insisted that patient was unsafe to live alone, and wanted him to go to SNF for rehab.  His insurance has denied authorization and patient is now appealing.  Appeal is still pending.  Assessment and Plan: 1-generalized weakness -Multifactorial; associated with physical disease and deconditioning from advanced age, electrolyte disturbances and dehydration.  Patient also found with B12 deficiency -Continue B12 supplementation (planning for intramuscular injection x 7 for 1 week, then weekly for 1 month and then monthly after that). -Fluid resuscitation and electrolytes are being repleted -Patient is hemodynamically stable for discharge to skilled nursing facility for further  care and rehab -Physical therapy has worked with patient recommending short-term rehabilitation; rolling walker and assistance with ambulation has been discussed with patient.  2-anemia -Stable -Further workup and evaluation as an outpatient -No overt bleeding appreciated.  3-hypertension -Stable and well-controlled -Continue the use of losartan -Heart healthy diet discussed with patient.  4-hyperlipidemia -Continue statin -Continue heart healthy diet.  5-type 2 diabetes -A1c 6.9 -Continue holding oral hypoglycemic agents while inpatient -Continue sliding scale insulin and modified carbohydrate diet.  6-vitamin B12 deficiency -Aggressive repletion will be initiated with intramuscular injection daily x 7 days; then weekly x 1 month and then monthly after that -Repeat levels in about 8 to 12 weeks with decisions at that time for preferred supplementation route.  7-pressure injury -Multiple stage II injuries without signs of superimposed infection has been appreciated (affecting bilateral thighs and right lateral foot).  Pressure injuries were present at time of admission. -Continue constant repositioning and local care.  8-moderate protein calorie malnutrition -Continue vitamin C, continue seeing and the use of feeding supplement -Appreciate assistance and recommendation by nutritional service.  Subjective:  In no acute distress; no fever, no nausea, no vomiting, no chest pain.  Feeling weak and deconditioned.  Following commands appropriately.  Physical Exam: Vitals:   09/01/23 1318 09/01/23 1919 09/02/23 0439 09/02/23 1451  BP: 96/64 117/69 110/76 110/65  Pulse: 99 85 68 74  Resp: 18 20 17 18   Temp: 98.6 F (37 C) 98 F (36.7 C) 98 F (36.7 C) 98.1 F (36.7 C)  TempSrc: Oral Oral    SpO2: 96% 94% 100%  99%  Weight:       General exam: In no acute distress, following commands appropriately and expressing feeling weak.  Patient requiring a lot of help with walking  and/or transfers.  Weak/deconditioned and expressing lower extremity weakness. Respiratory system: Good air movement bilaterally; no using accessory muscle.  Good saturation on room air. Cardiovascular system:RRR. No rubs or gallops; no JVD. Gastrointestinal system: Abdomen is nondistended, soft and nontender. No organomegaly or masses felt. Normal bowel sounds heard. Central nervous system:No focal neurological deficits. Extremities: No cyanosis or clubbing. Skin: No petechiae; unchanged stage II pressure injuries present at time of admission without signs of superimposed infection. Psychiatry: Flat Affect appreciated on examination.  Latest data Reviewed: CBC: White blood cells 4.7, hemoglobin 11.9 and platelet count 2 13K Basic metabolic panel: Sodium 135, potassium 3.8, chloride 103, bicarb 25, BUN 13, creatinine 0.70 Magnesium: 2.2  Family Communication: Significant other at bedside.  Disposition: Status is: Observation The patient will require care spanning > 2 midnights and should be moved to inpatient because: Physical deconditioning with requirement of short-term rehabilitation placement for safe discharge pathway.   Planned Discharge Destination: Skilled nursing facility  Time spent: 40 minutes  Author: Vassie Loll, MD 09/02/2023 3:55 PM  For on call review [email or web address].

## 2023-09-02 NOTE — Progress Notes (Signed)
Mobility Specialist Progress Note:    09/02/23 1330  Mobility  Activity Ambulated with assistance in hallway  Level of Assistance Contact guard assist, steadying assist  Assistive Device Front wheel walker  Distance Ambulated (ft) 150 ft  Range of Motion/Exercises Active;All extremities  Activity Response Tolerated well  Mobility Referral Yes  $Mobility charge 1 Mobility  Mobility Specialist Start Time (ACUTE ONLY) 1330  Mobility Specialist Stop Time (ACUTE ONLY) 1345  Mobility Specialist Time Calculation (min) (ACUTE ONLY) 15 min   Pt received in chair, agreeable to mobility session. Required MinA to stand and CGA to ambulate with RW. Tolerated well, asx throughout. Returned pt to chair, alarm on. All needs met.   [name] Mobility Specialist Please contact via Special educational needs teacher or  Rehab office at [phone]

## 2023-09-02 NOTE — TOC Progression Note (Signed)
Transition of Care [facility]) - Progression Note    Patient Details  Name: David Herrera MRN: [id number] Date of Birth: 08-02-1933  [facility]) CM/SW Contact  Karn Cassis, [place] Phone Number: 09/02/2023, 10:24 AM  Clinical Narrative: SNF appeal still pending. TOC will follow.         Barriers to Discharge: Insurance Authorization  Expected Discharge Plan and Services         Expected Discharge Date: 08/24/23                                     Social Determinants of Health (SDOH) Interventions SDOH Screenings   Food Insecurity: No Food Insecurity (08/24/2023)  Housing: Low Risk  (08/24/2023)  Transportation Needs: No Transportation Needs (08/24/2023)  Utilities: Not At Risk (08/24/2023)  Tobacco Use: Low Risk  (08/23/2023)    Readmission Risk Interventions     No data to display

## 2023-09-03 DIAGNOSIS — E440 Moderate protein-calorie malnutrition: Secondary | ICD-10-CM | POA: Diagnosis not present

## 2023-09-03 DIAGNOSIS — E1169 Type 2 diabetes mellitus with other specified complication: Secondary | ICD-10-CM | POA: Diagnosis not present

## 2023-09-03 DIAGNOSIS — L8990 Pressure ulcer of unspecified site, unspecified stage: Secondary | ICD-10-CM | POA: Diagnosis not present

## 2023-09-03 DIAGNOSIS — M169 Osteoarthritis of hip, unspecified: Secondary | ICD-10-CM | POA: Diagnosis not present

## 2023-09-03 DIAGNOSIS — I10 Essential (primary) hypertension: Secondary | ICD-10-CM | POA: Diagnosis not present

## 2023-09-03 DIAGNOSIS — E782 Mixed hyperlipidemia: Secondary | ICD-10-CM | POA: Diagnosis not present

## 2023-09-03 DIAGNOSIS — L89892 Pressure ulcer of other site, stage 2: Secondary | ICD-10-CM | POA: Diagnosis not present

## 2023-09-03 DIAGNOSIS — Z96641 Presence of right artificial hip joint: Secondary | ICD-10-CM | POA: Diagnosis not present

## 2023-09-03 DIAGNOSIS — R0602 Shortness of breath: Secondary | ICD-10-CM | POA: Diagnosis not present

## 2023-09-03 DIAGNOSIS — Z7982 Long term (current) use of aspirin: Secondary | ICD-10-CM | POA: Diagnosis not present

## 2023-09-03 DIAGNOSIS — M6281 Muscle weakness (generalized): Secondary | ICD-10-CM | POA: Diagnosis not present

## 2023-09-03 DIAGNOSIS — E871 Hypo-osmolality and hyponatremia: Secondary | ICD-10-CM | POA: Diagnosis not present

## 2023-09-03 DIAGNOSIS — R531 Weakness: Secondary | ICD-10-CM | POA: Diagnosis not present

## 2023-09-03 DIAGNOSIS — Z7984 Long term (current) use of oral hypoglycemic drugs: Secondary | ICD-10-CM | POA: Diagnosis not present

## 2023-09-03 DIAGNOSIS — E119 Type 2 diabetes mellitus without complications: Secondary | ICD-10-CM | POA: Diagnosis not present

## 2023-09-03 DIAGNOSIS — D638 Anemia in other chronic diseases classified elsewhere: Secondary | ICD-10-CM | POA: Diagnosis not present

## 2023-09-03 DIAGNOSIS — Z79899 Other long term (current) drug therapy: Secondary | ICD-10-CM | POA: Diagnosis not present

## 2023-09-03 DIAGNOSIS — E860 Dehydration: Secondary | ICD-10-CM | POA: Diagnosis not present

## 2023-09-03 DIAGNOSIS — E876 Hypokalemia: Secondary | ICD-10-CM | POA: Diagnosis not present

## 2023-09-03 DIAGNOSIS — R278 Other lack of coordination: Secondary | ICD-10-CM | POA: Diagnosis not present

## 2023-09-03 DIAGNOSIS — E1165 Type 2 diabetes mellitus with hyperglycemia: Secondary | ICD-10-CM | POA: Diagnosis not present

## 2023-09-03 DIAGNOSIS — Z85828 Personal history of other malignant neoplasm of skin: Secondary | ICD-10-CM | POA: Diagnosis not present

## 2023-09-03 DIAGNOSIS — E538 Deficiency of other specified B group vitamins: Secondary | ICD-10-CM | POA: Diagnosis not present

## 2023-09-03 DIAGNOSIS — D649 Anemia, unspecified: Secondary | ICD-10-CM | POA: Diagnosis not present

## 2023-09-03 LAB — GLUCOSE, CAPILLARY
Glucose-Capillary: 149 mg/dL — ABNORMAL HIGH (ref 70–99)
Glucose-Capillary: 191 mg/dL — ABNORMAL HIGH (ref 70–99)
Glucose-Capillary: 110 mg/dL — ABNORMAL HIGH (ref 70–99)

## 2023-09-03 MED ORDER — JUVEN PO PACK: 1.0000 | PACK | Freq: Two times a day (BID) | ORAL | Status: AC

## 2023-09-03 MED ORDER — CYANOCOBALAMIN 1000 MCG/ML IJ SOLN: INTRAMUSCULAR | 0 refills | Status: AC

## 2023-09-03 MED ORDER — ZINC SULFATE 220 (50 ZN) MG PO CAPS: 220.0000 mg | ORAL_CAPSULE | Freq: Every day | ORAL | Status: AC

## 2023-09-03 MED ORDER — ASCORBIC ACID 500 MG PO TABS: 500.0000 mg | ORAL_TABLET | Freq: Two times a day (BID) | ORAL | Status: AC

## 2023-09-03 MED ORDER — ADULT MULTIVITAMIN W/MINERALS CH: 1.0000 | ORAL_TABLET | Freq: Every day | ORAL | Status: AC

## 2023-09-03 NOTE — TOC Transition Note (Signed)
Transition of Care [facility]) - CM/SW Discharge Note   Patient Details  Name: STANLEY MERLE MRN: [id number] Date of Birth: March 23, 1933  Transition of Care [facility]) CM/SW Contact:  Annice Needy, LCSW Phone Number: 09/03/2023, 12:18 PM   Clinical Narrative:    D/c clinicals sent to facility. Nurse to call report. TOC signing off.    Final next level of care: Skilled Nursing Facility Barriers to Discharge: No Barriers Identified   Patient Goals and CMS Choice      Discharge Placement                Patient chooses bed at: Other - please specify in the comment section below: [facility]) Patient to be transferred to facility by: Hilarie Fredrickson Name of family member notified: son Patient and family notified of of transfer: 09/03/23  Discharge Plan and Services Additional resources added to the After Visit Summary for                                       Social Determinants of Health (SDOH) Interventions SDOH Screenings   Food Insecurity: No Food Insecurity (08/24/2023)  Housing: Low Risk  (08/24/2023)  Transportation Needs: No Transportation Needs (08/24/2023)  Utilities: Not At Risk (08/24/2023)  Tobacco Use: Low Risk  (08/23/2023)     Readmission Risk Interventions     No data to display

## 2023-09-03 NOTE — Discharge Summary (Signed)
Physician Discharge Summary   Patient: David Herrera MRN: [id number] DOB: Feb 08, 1933  Admit date:     08/23/2023  Discharge date: 09/03/23  Discharge Physician: Vassie Loll   PCP: Georgann Housekeeper, MD   Recommendations at discharge:  Repeat basic metabolic panel to follow electrolytes and renal function Repeat CBC to follow hemoglobin trend and stability Repeat B12 level in 4-103-month to follow patient's B12 level and further determine the need of intramuscular repletion. Reassess blood pressure and adjust antihypertensive treatment as needed.   Discharge Diagnoses: Principal Problem:   Generalized weakness Active Problems:   Essential hypertension   Weakness of right leg   Dehydration   Anemia of chronic disease   Hypokalemia   Hyponatremia   Type 2 diabetes mellitus with hyperglycemia (HCC)   Mixed hyperlipidemia   Malnutrition of moderate degree  Brief Hospital admission narrative:  89 year old male, reports that he lives alone, ambulates with the help of a cane, states that his son lives within a mile away, PMH of HTN, HLD, DM 2, presented to the ED due to generalized weakness that has been ongoing for the last 2 weeks and since then had progressed to inability to walk.  He sustained a right clavicle fracture about 6 weeks prior, followed up with his orthopedic surgeon on day of ED visit and stable right clavicle shaft fracture was noted.  He was advised to gradually increase activity.   ED Course:  In the emergency department, BP was 115/57, other vital signs were within normal range.  Workup in the ED showed normocytic anemia, BMP showed sodium 131, potassium 3.3, chloride 96, bicarb 23, blood glucose 177, BUN 19, creatinine 0.74.  BNP 80, TSH 1.648, urinalysis was normal, magnesium 1.7 CT of head showed no acute intracranial abnormality Chest x-ray showed no active disease.   Patient ambulated 70 feet with PT on 8/24 and 120 feet on 8/25.  Patient was planned for  discharge home with home health services on 8/24, however patient's son insisted that patient was unsafe to live alone, and wanted him to go to SNF for rehab.  Assessment and Plan: 1-generalized weakness -Multifactorial; associated with physical disease and deconditioning from advanced age, electrolyte disturbances and dehydration.  Patient also found with B12 deficiency -Continue B12 supplementation (planning for intramuscular injection x 7 for 1 week, then weekly for 1 month and then monthly after that). -Fluid resuscitation and electrolytes are being repleted -Patient is hemodynamically stable for discharge to skilled nursing facility for further care and rehab -Physical therapy has worked with patient recommending short-term rehabilitation; rolling walker and assistance with ambulation has been discussed with patient.   2-anemia -Stable -Further workup and evaluation as an outpatient -No overt bleeding appreciated.   3-hypertension -Stable and well-controlled -Continue the use of losartan -Heart healthy diet discussed with patient.   4-hyperlipidemia -Continue statin -Continue heart healthy diet.   5-type 2 diabetes -A1c 6.9 -Continue holding oral hypoglycemic agents while inpatient -Continue sliding scale insulin and modified carbohydrate diet.   6-vitamin B12 deficiency -Aggressive repletion will be initiated with intramuscular injection daily x 7 days; then weekly x 1 month and then monthly after that -Repeat levels in about 8 to 12 weeks with decisions at that time for preferred supplementation route.   7-pressure injury -Multiple stage II injuries without signs of superimposed infection has been appreciated (affecting bilateral thighs and right lateral foot).  Pressure injuries were present at time of admission. -Continue constant repositioning and local care.   8-moderate protein calorie  malnutrition -Continue vitamin C, continue seeing and the use of feeding  supplement -Appreciate assistance and recommendation by nutritional service.  Consultants: None Procedures performed: See below for x-ray reports. Disposition: Skilled nursing facility Diet recommendation: Heart healthy diet.  DISCHARGE MEDICATION: Allergies as of 09/03/2023   No Known Allergies      Medication List     TAKE these medications    acetaminophen 500 MG tablet Commonly known as: TYLENOL Take 500 mg by mouth 2 (two) times daily as needed for mild pain.   ascorbic acid 500 MG tablet Commonly known as: VITAMIN C Take 1 tablet (500 mg total) by mouth 2 (two) times daily.   cyanocobalamin 1000 MCG/ML injection Commonly known as: VITAMIN B12 Weekly X 3 doses (every Tuesday); then monthly injections. Start taking on: September 04, 2023   hydrochlorothiazide 25 MG tablet Commonly known as: HYDRODIURIL Take 25 mg by mouth 2 (two) times daily.   losartan 25 MG tablet Commonly known as: COZAAR Take 25 mg by mouth daily.   metFORMIN 1000 MG tablet Commonly known as: GLUCOPHAGE Take 500 mg by mouth 2 (two) times daily with a meal.   multivitamin with minerals Tabs tablet Take 1 tablet by mouth daily. Start taking on: September 04, 2023   nutrition supplement (JUVEN) Pack Take 1 packet by mouth 2 (two) times daily between meals.   simvastatin 20 MG tablet Commonly known as: ZOCOR Take 10 mg by mouth in the morning and at bedtime.   zinc sulfate 220 (50 Zn) MG capsule Take 1 capsule (220 mg total) by mouth daily. Start taking on: September 04, 2023               Discharge Care Instructions  (From admission, onward)           Start     Ordered   09/03/23 0000  Discharge wound care:       Comments: Keep affected pressure injury areas clean and dry and provide constant repositioning.   09/03/23 0955            Contact information for follow-up providers     Georgann Housekeeper, MD. Schedule an appointment as soon as possible for a visit in 2  week(s).   Specialty: Internal Medicine Why: After being discharged from the skilled nursing facility. Contact information: [address] [place] [address] [phone]              Contact information for after-discharge care     Destination     [facility] .   Service: Skilled Nursing Contact information: [address] [place] [address] [phone]                    Discharge Exam: Ceasar Mons Weights   08/24/23 0014  Weight: 71.3 kg   General exam: In no acute distress, following commands appropriately and expressing feeling weak.  Patient requiring a lot of help with walking and/or transfers.  Weak/deconditioned and expressing lower extremity weakness. Respiratory system: Good air movement bilaterally; no using accessory muscle.  Good saturation on room air. Cardiovascular system:RRR. No rubs or gallops; no JVD. Gastrointestinal system: Abdomen is nondistended, soft and nontender. No organomegaly or masses felt. Normal bowel sounds heard. Central nervous system:No focal neurological deficits. Extremities: No cyanosis or clubbing. Skin: No petechiae; unchanged stage II pressure injuries present at time of admission without signs of superimposed infection. Psychiatry: Flat Affect appreciated on examination.  Condition at discharge: Stable and improved.  The results of significant diagnostics from this hospitalization (including imaging, microbiology, ancillary and laboratory) are listed below for reference.   Imaging Studies: CT Head Wo Contrast  Result Date: 08/23/2023 CLINICAL DATA:  Neuro deficit, acute, stroke suspected EXAM: CT HEAD WITHOUT CONTRAST TECHNIQUE: Contiguous axial images were obtained from the base of the skull through the vertex without intravenous contrast. RADIATION DOSE REDUCTION: This exam was performed according to the departmental dose-optimization program  which includes automated exposure control, adjustment of the mA and/or kV according to patient size and/or use of iterative reconstruction technique. COMPARISON:  11/14/2016 FINDINGS: Brain: There is atrophy and chronic small vessel disease changes. No acute intracranial abnormality. Specifically, no hemorrhage, hydrocephalus, mass lesion, acute infarction, or significant intracranial injury. Vascular: No hyperdense vessel or unexpected calcification. Skull: No acute calvarial abnormality. Sinuses/Orbits: Inspissated mucus in the left frontal sinus with calcifications, unchanged. No air-fluid levels. Mastoid air cells clear. Other: None IMPRESSION: Atrophy, chronic microvascular disease. No acute intracranial abnormality. Electronically Signed   By: Charlett Nose M.D.   On: 08/23/2023 23:29   DG Shoulder Right  Result Date: 08/23/2023 X-rays of the right shoulder were obtained in clinic today.  These are compared to previous x-rays.  Fracture of the mid clavicle shaft remains in stable position.  There is some callus formation.  No further injuries.  Shoulder is reduced.  Minimal degenerative changes.  Impression: Stable right clavicle shaft fracture   DG Chest Portable 1 View  Result Date: 08/23/2023 CLINICAL DATA:  Weakness EXAM: PORTABLE CHEST 1 VIEW COMPARISON:  08/25/2013 FINDINGS: Ununited displaced right mid clavicular fracture. No acute airspace disease. Stable cardiomediastinal silhouette with aortic atherosclerosis. Old appearing deformity of the proximal left humerus. IMPRESSION: 1. No active disease. Electronically Signed   By: Jasmine Pang M.D.   On: 08/23/2023 19:49   DG Clavicle Right  Result Date: 08/14/2023 X-rays of the right clavicle were obtained in clinic today.  These are compared to prior x-rays.  There is a fracture of the mid right clavicle shaft.  No interval displacement.  There is some interval callus formation.  No additional injuries are noted.  Glenohumeral joint is  reduced.  Impression: Healing right midshaft clavicle fracture without further displacement.    Microbiology: Results for orders placed or performed in visit on 12/01/19  Novel Coronavirus, NAA ([facility])     Status: None   Collection Time: 12/01/19 10:02 AM   Specimen: Oropharyngeal(OP) collection in vial transport medium   OROPHARYNGEA  TESTING  Result Value Ref Range Status   SARS-CoV-2, NAA Not Detected Not Detected Final    Comment: This nucleic acid amplification test was developed and its performance characteristics determined by [employer]. Nucleic acid amplification tests include PCR and TMA. This test has not been FDA cleared or approved. This test has been authorized by FDA under an Emergency Use Authorization (EUA). This test is only authorized for the duration of time the declaration that circumstances exist justifying the authorization of the emergency use of in vitro diagnostic tests for detection of SARS-CoV-2 virus and/or diagnosis of COVID-19 infection under section 564(b)(1) of the Act, 21 U.S.C. 782NFA-2(Z) (1), unless the authorization is terminated or revoked sooner. When diagnostic testing is negative, the possibility of a false negative result should be considered in the context of a patient's recent exposures and the presence of clinical signs and symptoms consistent with COVID-19. An individual without symptoms of COVID-19 and who is not shedding SARS-CoV-2 virus  would  expect to have a negative (not detected) result in this assay.    Basic Metabolic Panel: Recent Labs  Lab 08/31/23 0348  CREATININE 0.71   CBG: Recent Labs  Lab 09/02/23 0723 09/02/23 1137 09/02/23 1617 09/02/23 1949 09/03/23 0743  GLUCAP 109* 145* 92 237* 191*    Discharge time spent: greater than 30 minutes.  Signed: Vassie Loll, MD [facility] 09/03/2023

## 2023-09-03 NOTE — Progress Notes (Signed)
Mobility Specialist Progress Note:    09/03/23 1015  Mobility  Activity Ambulated with assistance in hallway  Level of Assistance Contact guard assist, steadying assist  Assistive Device Front wheel walker  Distance Ambulated (ft) 20 ft  Range of Motion/Exercises Active;All extremities  Activity Response Tolerated well  Mobility Referral Yes  $Mobility charge 1 Mobility  Mobility Specialist Start Time (ACUTE ONLY) 1015  Mobility Specialist Stop Time (ACUTE ONLY) 1025  Mobility Specialist Time Calculation (min) (ACUTE ONLY) 10 min   Pt received in chair, agreeable to mobility. Required CGA to stand and ambulate with RW. Tolerated well, asx throughout. Returned pt to chair, son in room. All needs met.   [name] Mobility Specialist Please contact via Special educational needs teacher or  Rehab office at [phone]

## 2023-09-03 NOTE — Progress Notes (Signed)
Spoke w/ Revonda Standard at [facility] and gave report.

## 2023-09-04 DIAGNOSIS — E538 Deficiency of other specified B group vitamins: Secondary | ICD-10-CM | POA: Diagnosis not present

## 2023-09-04 DIAGNOSIS — M6281 Muscle weakness (generalized): Secondary | ICD-10-CM | POA: Diagnosis not present

## 2023-09-04 DIAGNOSIS — I10 Essential (primary) hypertension: Secondary | ICD-10-CM | POA: Diagnosis not present

## 2023-09-04 DIAGNOSIS — E1169 Type 2 diabetes mellitus with other specified complication: Secondary | ICD-10-CM | POA: Diagnosis not present

## 2023-09-04 DIAGNOSIS — D649 Anemia, unspecified: Secondary | ICD-10-CM | POA: Diagnosis not present

## 2023-09-04 DIAGNOSIS — E119 Type 2 diabetes mellitus without complications: Secondary | ICD-10-CM | POA: Diagnosis not present

## 2023-09-04 DIAGNOSIS — E782 Mixed hyperlipidemia: Secondary | ICD-10-CM | POA: Diagnosis not present

## 2023-09-06 DIAGNOSIS — E119 Type 2 diabetes mellitus without complications: Secondary | ICD-10-CM | POA: Diagnosis not present

## 2023-09-06 DIAGNOSIS — M6281 Muscle weakness (generalized): Secondary | ICD-10-CM | POA: Diagnosis not present

## 2023-09-06 DIAGNOSIS — I10 Essential (primary) hypertension: Secondary | ICD-10-CM | POA: Diagnosis not present

## 2023-09-06 DIAGNOSIS — E782 Mixed hyperlipidemia: Secondary | ICD-10-CM | POA: Diagnosis not present

## 2023-09-06 DIAGNOSIS — D649 Anemia, unspecified: Secondary | ICD-10-CM | POA: Diagnosis not present

## 2023-09-06 DIAGNOSIS — E538 Deficiency of other specified B group vitamins: Secondary | ICD-10-CM | POA: Diagnosis not present

## 2023-09-06 DIAGNOSIS — E1169 Type 2 diabetes mellitus with other specified complication: Secondary | ICD-10-CM | POA: Diagnosis not present

## 2023-09-09 DIAGNOSIS — E782 Mixed hyperlipidemia: Secondary | ICD-10-CM | POA: Diagnosis not present

## 2023-09-09 DIAGNOSIS — I10 Essential (primary) hypertension: Secondary | ICD-10-CM | POA: Diagnosis not present

## 2023-09-09 DIAGNOSIS — E1169 Type 2 diabetes mellitus with other specified complication: Secondary | ICD-10-CM | POA: Diagnosis not present

## 2023-09-09 DIAGNOSIS — M6281 Muscle weakness (generalized): Secondary | ICD-10-CM | POA: Diagnosis not present

## 2023-09-09 DIAGNOSIS — E119 Type 2 diabetes mellitus without complications: Secondary | ICD-10-CM | POA: Diagnosis not present

## 2023-09-09 DIAGNOSIS — E538 Deficiency of other specified B group vitamins: Secondary | ICD-10-CM | POA: Diagnosis not present

## 2023-09-09 DIAGNOSIS — D649 Anemia, unspecified: Secondary | ICD-10-CM | POA: Diagnosis not present

## 2023-09-11 DIAGNOSIS — D649 Anemia, unspecified: Secondary | ICD-10-CM | POA: Diagnosis not present

## 2023-09-11 DIAGNOSIS — E1169 Type 2 diabetes mellitus with other specified complication: Secondary | ICD-10-CM | POA: Diagnosis not present

## 2023-09-11 DIAGNOSIS — E538 Deficiency of other specified B group vitamins: Secondary | ICD-10-CM | POA: Diagnosis not present

## 2023-09-11 DIAGNOSIS — I10 Essential (primary) hypertension: Secondary | ICD-10-CM | POA: Diagnosis not present

## 2023-09-11 DIAGNOSIS — M6281 Muscle weakness (generalized): Secondary | ICD-10-CM | POA: Diagnosis not present

## 2023-09-11 DIAGNOSIS — E782 Mixed hyperlipidemia: Secondary | ICD-10-CM | POA: Diagnosis not present

## 2023-09-11 DIAGNOSIS — E119 Type 2 diabetes mellitus without complications: Secondary | ICD-10-CM | POA: Diagnosis not present

## 2023-09-12 DIAGNOSIS — I10 Essential (primary) hypertension: Secondary | ICD-10-CM | POA: Diagnosis not present

## 2023-09-12 DIAGNOSIS — E782 Mixed hyperlipidemia: Secondary | ICD-10-CM | POA: Diagnosis not present

## 2023-09-12 DIAGNOSIS — L89892 Pressure ulcer of other site, stage 2: Secondary | ICD-10-CM | POA: Diagnosis not present

## 2023-09-12 DIAGNOSIS — E538 Deficiency of other specified B group vitamins: Secondary | ICD-10-CM | POA: Diagnosis not present

## 2023-09-12 DIAGNOSIS — M6281 Muscle weakness (generalized): Secondary | ICD-10-CM | POA: Diagnosis not present

## 2023-09-13 DIAGNOSIS — M6281 Muscle weakness (generalized): Secondary | ICD-10-CM | POA: Diagnosis not present

## 2023-09-13 DIAGNOSIS — E538 Deficiency of other specified B group vitamins: Secondary | ICD-10-CM | POA: Diagnosis not present

## 2023-09-13 DIAGNOSIS — E1169 Type 2 diabetes mellitus with other specified complication: Secondary | ICD-10-CM | POA: Diagnosis not present

## 2023-09-13 DIAGNOSIS — E119 Type 2 diabetes mellitus without complications: Secondary | ICD-10-CM | POA: Diagnosis not present

## 2023-09-13 DIAGNOSIS — D649 Anemia, unspecified: Secondary | ICD-10-CM | POA: Diagnosis not present

## 2023-09-13 DIAGNOSIS — I10 Essential (primary) hypertension: Secondary | ICD-10-CM | POA: Diagnosis not present

## 2023-09-13 DIAGNOSIS — E782 Mixed hyperlipidemia: Secondary | ICD-10-CM | POA: Diagnosis not present

## 2023-09-16 DIAGNOSIS — I10 Essential (primary) hypertension: Secondary | ICD-10-CM | POA: Diagnosis not present

## 2023-09-16 DIAGNOSIS — M6281 Muscle weakness (generalized): Secondary | ICD-10-CM | POA: Diagnosis not present

## 2023-09-16 DIAGNOSIS — E538 Deficiency of other specified B group vitamins: Secondary | ICD-10-CM | POA: Diagnosis not present

## 2023-09-16 DIAGNOSIS — E782 Mixed hyperlipidemia: Secondary | ICD-10-CM | POA: Diagnosis not present

## 2023-09-16 DIAGNOSIS — E119 Type 2 diabetes mellitus without complications: Secondary | ICD-10-CM | POA: Diagnosis not present

## 2023-09-16 DIAGNOSIS — E1169 Type 2 diabetes mellitus with other specified complication: Secondary | ICD-10-CM | POA: Diagnosis not present

## 2023-09-16 DIAGNOSIS — D649 Anemia, unspecified: Secondary | ICD-10-CM | POA: Diagnosis not present

## 2023-09-20 DIAGNOSIS — M1990 Unspecified osteoarthritis, unspecified site: Secondary | ICD-10-CM | POA: Diagnosis not present

## 2023-09-20 DIAGNOSIS — Z604 Social exclusion and rejection: Secondary | ICD-10-CM | POA: Diagnosis not present

## 2023-09-20 DIAGNOSIS — E46 Unspecified protein-calorie malnutrition: Secondary | ICD-10-CM | POA: Diagnosis not present

## 2023-09-20 DIAGNOSIS — Z9181 History of falling: Secondary | ICD-10-CM | POA: Diagnosis not present

## 2023-09-20 DIAGNOSIS — E782 Mixed hyperlipidemia: Secondary | ICD-10-CM | POA: Diagnosis not present

## 2023-09-20 DIAGNOSIS — Z556 Problems related to health literacy: Secondary | ICD-10-CM | POA: Diagnosis not present

## 2023-09-20 DIAGNOSIS — Z7984 Long term (current) use of oral hypoglycemic drugs: Secondary | ICD-10-CM | POA: Diagnosis not present

## 2023-09-20 DIAGNOSIS — Z96649 Presence of unspecified artificial hip joint: Secondary | ICD-10-CM | POA: Diagnosis not present

## 2023-09-20 DIAGNOSIS — Z952 Presence of prosthetic heart valve: Secondary | ICD-10-CM | POA: Diagnosis not present

## 2023-09-20 DIAGNOSIS — S42021D Displaced fracture of shaft of right clavicle, subsequent encounter for fracture with routine healing: Secondary | ICD-10-CM | POA: Diagnosis not present

## 2023-09-20 DIAGNOSIS — D509 Iron deficiency anemia, unspecified: Secondary | ICD-10-CM | POA: Diagnosis not present

## 2023-09-20 DIAGNOSIS — I10 Essential (primary) hypertension: Secondary | ICD-10-CM | POA: Diagnosis not present

## 2023-09-20 DIAGNOSIS — E1169 Type 2 diabetes mellitus with other specified complication: Secondary | ICD-10-CM | POA: Diagnosis not present

## 2023-09-20 DIAGNOSIS — E538 Deficiency of other specified B group vitamins: Secondary | ICD-10-CM | POA: Diagnosis not present

## 2023-09-24 DIAGNOSIS — M1990 Unspecified osteoarthritis, unspecified site: Secondary | ICD-10-CM | POA: Diagnosis not present

## 2023-09-24 DIAGNOSIS — E1169 Type 2 diabetes mellitus with other specified complication: Secondary | ICD-10-CM | POA: Diagnosis not present

## 2023-09-24 DIAGNOSIS — Z96649 Presence of unspecified artificial hip joint: Secondary | ICD-10-CM | POA: Diagnosis not present

## 2023-09-24 DIAGNOSIS — E538 Deficiency of other specified B group vitamins: Secondary | ICD-10-CM | POA: Diagnosis not present

## 2023-09-24 DIAGNOSIS — I10 Essential (primary) hypertension: Secondary | ICD-10-CM | POA: Diagnosis not present

## 2023-09-24 DIAGNOSIS — E46 Unspecified protein-calorie malnutrition: Secondary | ICD-10-CM | POA: Diagnosis not present

## 2023-09-24 DIAGNOSIS — Z952 Presence of prosthetic heart valve: Secondary | ICD-10-CM | POA: Diagnosis not present

## 2023-09-24 DIAGNOSIS — E782 Mixed hyperlipidemia: Secondary | ICD-10-CM | POA: Diagnosis not present

## 2023-09-24 DIAGNOSIS — Z556 Problems related to health literacy: Secondary | ICD-10-CM | POA: Diagnosis not present

## 2023-09-24 DIAGNOSIS — D509 Iron deficiency anemia, unspecified: Secondary | ICD-10-CM | POA: Diagnosis not present

## 2023-09-24 DIAGNOSIS — S42021D Displaced fracture of shaft of right clavicle, subsequent encounter for fracture with routine healing: Secondary | ICD-10-CM | POA: Diagnosis not present

## 2023-09-24 DIAGNOSIS — Z7984 Long term (current) use of oral hypoglycemic drugs: Secondary | ICD-10-CM | POA: Diagnosis not present

## 2023-09-24 DIAGNOSIS — Z604 Social exclusion and rejection: Secondary | ICD-10-CM | POA: Diagnosis not present

## 2023-09-24 DIAGNOSIS — Z9181 History of falling: Secondary | ICD-10-CM | POA: Diagnosis not present

## 2023-10-02 DIAGNOSIS — I10 Essential (primary) hypertension: Secondary | ICD-10-CM | POA: Diagnosis not present

## 2023-10-02 DIAGNOSIS — E1165 Type 2 diabetes mellitus with hyperglycemia: Secondary | ICD-10-CM | POA: Diagnosis not present

## 2023-10-02 DIAGNOSIS — E538 Deficiency of other specified B group vitamins: Secondary | ICD-10-CM | POA: Insufficient documentation

## 2023-10-02 DIAGNOSIS — M6281 Muscle weakness (generalized): Secondary | ICD-10-CM | POA: Insufficient documentation

## 2023-10-02 DIAGNOSIS — L84 Corns and callosities: Secondary | ICD-10-CM | POA: Insufficient documentation

## 2023-10-02 DIAGNOSIS — E782 Mixed hyperlipidemia: Secondary | ICD-10-CM | POA: Diagnosis not present

## 2023-10-03 DIAGNOSIS — I10 Essential (primary) hypertension: Secondary | ICD-10-CM | POA: Diagnosis not present

## 2023-10-03 DIAGNOSIS — Z604 Social exclusion and rejection: Secondary | ICD-10-CM | POA: Diagnosis not present

## 2023-10-03 DIAGNOSIS — D509 Iron deficiency anemia, unspecified: Secondary | ICD-10-CM | POA: Diagnosis not present

## 2023-10-03 DIAGNOSIS — M1990 Unspecified osteoarthritis, unspecified site: Secondary | ICD-10-CM | POA: Diagnosis not present

## 2023-10-03 DIAGNOSIS — Z556 Problems related to health literacy: Secondary | ICD-10-CM | POA: Diagnosis not present

## 2023-10-03 DIAGNOSIS — E46 Unspecified protein-calorie malnutrition: Secondary | ICD-10-CM | POA: Diagnosis not present

## 2023-10-03 DIAGNOSIS — E1169 Type 2 diabetes mellitus with other specified complication: Secondary | ICD-10-CM | POA: Diagnosis not present

## 2023-10-03 DIAGNOSIS — S42021D Displaced fracture of shaft of right clavicle, subsequent encounter for fracture with routine healing: Secondary | ICD-10-CM | POA: Diagnosis not present

## 2023-10-03 DIAGNOSIS — Z9181 History of falling: Secondary | ICD-10-CM | POA: Diagnosis not present

## 2023-10-03 DIAGNOSIS — E782 Mixed hyperlipidemia: Secondary | ICD-10-CM | POA: Diagnosis not present

## 2023-10-03 DIAGNOSIS — E538 Deficiency of other specified B group vitamins: Secondary | ICD-10-CM | POA: Diagnosis not present

## 2023-10-03 DIAGNOSIS — Z7984 Long term (current) use of oral hypoglycemic drugs: Secondary | ICD-10-CM | POA: Diagnosis not present

## 2023-10-03 DIAGNOSIS — Z96649 Presence of unspecified artificial hip joint: Secondary | ICD-10-CM | POA: Diagnosis not present

## 2023-10-03 DIAGNOSIS — Z952 Presence of prosthetic heart valve: Secondary | ICD-10-CM | POA: Diagnosis not present

## 2023-10-04 DIAGNOSIS — E782 Mixed hyperlipidemia: Secondary | ICD-10-CM | POA: Diagnosis not present

## 2023-10-04 DIAGNOSIS — Z96649 Presence of unspecified artificial hip joint: Secondary | ICD-10-CM | POA: Diagnosis not present

## 2023-10-04 DIAGNOSIS — E538 Deficiency of other specified B group vitamins: Secondary | ICD-10-CM | POA: Diagnosis not present

## 2023-10-04 DIAGNOSIS — E1169 Type 2 diabetes mellitus with other specified complication: Secondary | ICD-10-CM | POA: Diagnosis not present

## 2023-10-04 DIAGNOSIS — S42021D Displaced fracture of shaft of right clavicle, subsequent encounter for fracture with routine healing: Secondary | ICD-10-CM | POA: Diagnosis not present

## 2023-10-04 DIAGNOSIS — Z952 Presence of prosthetic heart valve: Secondary | ICD-10-CM | POA: Diagnosis not present

## 2023-10-04 DIAGNOSIS — Z604 Social exclusion and rejection: Secondary | ICD-10-CM | POA: Diagnosis not present

## 2023-10-04 DIAGNOSIS — Z9181 History of falling: Secondary | ICD-10-CM | POA: Diagnosis not present

## 2023-10-04 DIAGNOSIS — Z556 Problems related to health literacy: Secondary | ICD-10-CM | POA: Diagnosis not present

## 2023-10-04 DIAGNOSIS — M1990 Unspecified osteoarthritis, unspecified site: Secondary | ICD-10-CM | POA: Diagnosis not present

## 2023-10-04 DIAGNOSIS — I10 Essential (primary) hypertension: Secondary | ICD-10-CM | POA: Diagnosis not present

## 2023-10-04 DIAGNOSIS — Z7984 Long term (current) use of oral hypoglycemic drugs: Secondary | ICD-10-CM | POA: Diagnosis not present

## 2023-10-04 DIAGNOSIS — E46 Unspecified protein-calorie malnutrition: Secondary | ICD-10-CM | POA: Diagnosis not present

## 2023-10-04 DIAGNOSIS — D509 Iron deficiency anemia, unspecified: Secondary | ICD-10-CM | POA: Diagnosis not present

## 2023-10-09 DIAGNOSIS — M1990 Unspecified osteoarthritis, unspecified site: Secondary | ICD-10-CM | POA: Diagnosis not present

## 2023-10-09 DIAGNOSIS — Z952 Presence of prosthetic heart valve: Secondary | ICD-10-CM | POA: Diagnosis not present

## 2023-10-09 DIAGNOSIS — Z604 Social exclusion and rejection: Secondary | ICD-10-CM | POA: Diagnosis not present

## 2023-10-09 DIAGNOSIS — Z9181 History of falling: Secondary | ICD-10-CM | POA: Diagnosis not present

## 2023-10-09 DIAGNOSIS — E538 Deficiency of other specified B group vitamins: Secondary | ICD-10-CM | POA: Diagnosis not present

## 2023-10-09 DIAGNOSIS — E46 Unspecified protein-calorie malnutrition: Secondary | ICD-10-CM | POA: Diagnosis not present

## 2023-10-09 DIAGNOSIS — D509 Iron deficiency anemia, unspecified: Secondary | ICD-10-CM | POA: Diagnosis not present

## 2023-10-09 DIAGNOSIS — I10 Essential (primary) hypertension: Secondary | ICD-10-CM | POA: Diagnosis not present

## 2023-10-09 DIAGNOSIS — E782 Mixed hyperlipidemia: Secondary | ICD-10-CM | POA: Diagnosis not present

## 2023-10-09 DIAGNOSIS — E1169 Type 2 diabetes mellitus with other specified complication: Secondary | ICD-10-CM | POA: Diagnosis not present

## 2023-10-09 DIAGNOSIS — Z556 Problems related to health literacy: Secondary | ICD-10-CM | POA: Diagnosis not present

## 2023-10-09 DIAGNOSIS — Z96649 Presence of unspecified artificial hip joint: Secondary | ICD-10-CM | POA: Diagnosis not present

## 2023-10-09 DIAGNOSIS — S42021D Displaced fracture of shaft of right clavicle, subsequent encounter for fracture with routine healing: Secondary | ICD-10-CM | POA: Diagnosis not present

## 2023-10-09 DIAGNOSIS — Z7984 Long term (current) use of oral hypoglycemic drugs: Secondary | ICD-10-CM | POA: Diagnosis not present

## 2023-10-11 DIAGNOSIS — M1990 Unspecified osteoarthritis, unspecified site: Secondary | ICD-10-CM | POA: Diagnosis not present

## 2023-10-11 DIAGNOSIS — E538 Deficiency of other specified B group vitamins: Secondary | ICD-10-CM | POA: Diagnosis not present

## 2023-10-11 DIAGNOSIS — E46 Unspecified protein-calorie malnutrition: Secondary | ICD-10-CM | POA: Diagnosis not present

## 2023-10-11 DIAGNOSIS — E782 Mixed hyperlipidemia: Secondary | ICD-10-CM | POA: Diagnosis not present

## 2023-10-11 DIAGNOSIS — E1169 Type 2 diabetes mellitus with other specified complication: Secondary | ICD-10-CM | POA: Diagnosis not present

## 2023-10-11 DIAGNOSIS — D509 Iron deficiency anemia, unspecified: Secondary | ICD-10-CM | POA: Diagnosis not present

## 2023-10-11 DIAGNOSIS — Z556 Problems related to health literacy: Secondary | ICD-10-CM | POA: Diagnosis not present

## 2023-10-11 DIAGNOSIS — Z9181 History of falling: Secondary | ICD-10-CM | POA: Diagnosis not present

## 2023-10-11 DIAGNOSIS — I10 Essential (primary) hypertension: Secondary | ICD-10-CM | POA: Diagnosis not present

## 2023-10-11 DIAGNOSIS — Z7984 Long term (current) use of oral hypoglycemic drugs: Secondary | ICD-10-CM | POA: Diagnosis not present

## 2023-10-11 DIAGNOSIS — Z604 Social exclusion and rejection: Secondary | ICD-10-CM | POA: Diagnosis not present

## 2023-10-11 DIAGNOSIS — Z952 Presence of prosthetic heart valve: Secondary | ICD-10-CM | POA: Diagnosis not present

## 2023-10-11 DIAGNOSIS — Z96649 Presence of unspecified artificial hip joint: Secondary | ICD-10-CM | POA: Diagnosis not present

## 2023-10-11 DIAGNOSIS — S42021D Displaced fracture of shaft of right clavicle, subsequent encounter for fracture with routine healing: Secondary | ICD-10-CM | POA: Diagnosis not present

## 2023-10-15 DIAGNOSIS — Z9181 History of falling: Secondary | ICD-10-CM | POA: Diagnosis not present

## 2023-10-15 DIAGNOSIS — I10 Essential (primary) hypertension: Secondary | ICD-10-CM | POA: Diagnosis not present

## 2023-10-15 DIAGNOSIS — Z7984 Long term (current) use of oral hypoglycemic drugs: Secondary | ICD-10-CM | POA: Diagnosis not present

## 2023-10-15 DIAGNOSIS — E1169 Type 2 diabetes mellitus with other specified complication: Secondary | ICD-10-CM | POA: Diagnosis not present

## 2023-10-15 DIAGNOSIS — S42021D Displaced fracture of shaft of right clavicle, subsequent encounter for fracture with routine healing: Secondary | ICD-10-CM | POA: Diagnosis not present

## 2023-10-15 DIAGNOSIS — Z604 Social exclusion and rejection: Secondary | ICD-10-CM | POA: Diagnosis not present

## 2023-10-15 DIAGNOSIS — M1990 Unspecified osteoarthritis, unspecified site: Secondary | ICD-10-CM | POA: Diagnosis not present

## 2023-10-15 DIAGNOSIS — E538 Deficiency of other specified B group vitamins: Secondary | ICD-10-CM | POA: Diagnosis not present

## 2023-10-15 DIAGNOSIS — Z952 Presence of prosthetic heart valve: Secondary | ICD-10-CM | POA: Diagnosis not present

## 2023-10-15 DIAGNOSIS — E46 Unspecified protein-calorie malnutrition: Secondary | ICD-10-CM | POA: Diagnosis not present

## 2023-10-15 DIAGNOSIS — Z96649 Presence of unspecified artificial hip joint: Secondary | ICD-10-CM | POA: Diagnosis not present

## 2023-10-15 DIAGNOSIS — E782 Mixed hyperlipidemia: Secondary | ICD-10-CM | POA: Diagnosis not present

## 2023-10-15 DIAGNOSIS — Z556 Problems related to health literacy: Secondary | ICD-10-CM | POA: Diagnosis not present

## 2023-10-15 DIAGNOSIS — D509 Iron deficiency anemia, unspecified: Secondary | ICD-10-CM | POA: Diagnosis not present

## 2023-10-22 DIAGNOSIS — E538 Deficiency of other specified B group vitamins: Secondary | ICD-10-CM | POA: Diagnosis not present

## 2023-10-22 DIAGNOSIS — Z96649 Presence of unspecified artificial hip joint: Secondary | ICD-10-CM | POA: Diagnosis not present

## 2023-10-22 DIAGNOSIS — S42021D Displaced fracture of shaft of right clavicle, subsequent encounter for fracture with routine healing: Secondary | ICD-10-CM | POA: Diagnosis not present

## 2023-10-22 DIAGNOSIS — Z7984 Long term (current) use of oral hypoglycemic drugs: Secondary | ICD-10-CM | POA: Diagnosis not present

## 2023-10-22 DIAGNOSIS — E1169 Type 2 diabetes mellitus with other specified complication: Secondary | ICD-10-CM | POA: Diagnosis not present

## 2023-10-22 DIAGNOSIS — Z9181 History of falling: Secondary | ICD-10-CM | POA: Diagnosis not present

## 2023-10-22 DIAGNOSIS — Z556 Problems related to health literacy: Secondary | ICD-10-CM | POA: Diagnosis not present

## 2023-10-22 DIAGNOSIS — E782 Mixed hyperlipidemia: Secondary | ICD-10-CM | POA: Diagnosis not present

## 2023-10-22 DIAGNOSIS — Z952 Presence of prosthetic heart valve: Secondary | ICD-10-CM | POA: Diagnosis not present

## 2023-10-22 DIAGNOSIS — D509 Iron deficiency anemia, unspecified: Secondary | ICD-10-CM | POA: Diagnosis not present

## 2023-10-22 DIAGNOSIS — M1990 Unspecified osteoarthritis, unspecified site: Secondary | ICD-10-CM | POA: Diagnosis not present

## 2023-10-22 DIAGNOSIS — E46 Unspecified protein-calorie malnutrition: Secondary | ICD-10-CM | POA: Diagnosis not present

## 2023-10-22 DIAGNOSIS — Z604 Social exclusion and rejection: Secondary | ICD-10-CM | POA: Diagnosis not present

## 2023-10-22 DIAGNOSIS — I10 Essential (primary) hypertension: Secondary | ICD-10-CM | POA: Diagnosis not present

## 2023-10-23 ENCOUNTER — Encounter: Payer: Self-pay | Admitting: Podiatry

## 2023-10-23 ENCOUNTER — Ambulatory Visit: Payer: Medicare Other | Admitting: Podiatry

## 2023-10-23 DIAGNOSIS — M2042 Other hammer toe(s) (acquired), left foot: Secondary | ICD-10-CM

## 2023-10-23 DIAGNOSIS — M79675 Pain in left toe(s): Secondary | ICD-10-CM

## 2023-10-23 DIAGNOSIS — E1142 Type 2 diabetes mellitus with diabetic polyneuropathy: Secondary | ICD-10-CM | POA: Diagnosis not present

## 2023-10-23 DIAGNOSIS — E1165 Type 2 diabetes mellitus with hyperglycemia: Secondary | ICD-10-CM | POA: Diagnosis not present

## 2023-10-23 DIAGNOSIS — B351 Tinea unguium: Secondary | ICD-10-CM | POA: Diagnosis not present

## 2023-10-23 DIAGNOSIS — M79674 Pain in right toe(s): Secondary | ICD-10-CM | POA: Diagnosis not present

## 2023-10-23 DIAGNOSIS — M2041 Other hammer toe(s) (acquired), right foot: Secondary | ICD-10-CM | POA: Diagnosis not present

## 2023-10-23 NOTE — Progress Notes (Signed)
  Subjective:  Patient ID: David Herrera, male    DOB: 02/01/1933,   MRN: [id number]  Chief Complaint  Patient presents with   Nail Problem    DFC.    89 y.o. male presents for concern of thickened elongated and painful nails that are difficult to trim. Requesting to have them trimmed today. Relates burning and tingling in their feet. Patient is diabetic and last A1c was  Lab Results  Component Value Date   HGBA1C 6.9 (H) 08/24/2023   .   PCP:  Shelva Majestic T, DO    . Denies any other pedal complaints. Denies n/v/f/c.   Past Medical History:  Diagnosis Date   Basal cell carcinoma 06/14/2003   left flank   Basal cell carcinoma 06/14/2003   right upper back   Basal cell carcinoma 06/14/2003   left arm   Basal cell carcinoma 01/13/2017   left back of neck/hairline   Basal cell carcinoma 06/02/2015   right leg   Basal cell carcinoma 04/08/2018   left chest   Diabetes mellitus without complication (HCC)    DJD (degenerative joint disease) of hip    right   HOH (hard of hearing)    Hypertension    Melanoma (HCC) 06/14/2003   let outer back   Rosacea    Rosacea    Vertigo     Objective:  Physical Exam: Vascular: DP/PT pulses 2/4 bilateral. CFT <3 seconds. Absent hair growth on digits. Edema noted to bilateral lower extremities. Xerosis noted bilaterally.  Skin. No lacerations or abrasions bilateral feet. Nails 1-5 bilateral  are thickened discolored and elongated with subungual debris.  Musculoskeletal: MMT 5/5 bilateral lower extremities in DF, PF, Inversion and Eversion. Deceased ROM in DF of ankle joint. Hammered digits 2-5. Mild prominence of styloid process of right fifth metatarsal. No open lesions.  Neurological: Sensation intact to light touch. Protective sensation diminished bilateral.    Assessment:   1. Pain due to onychomycosis of toenails of both feet   2. Type 2 diabetes mellitus with diabetic polyneuropathy, without long-term current use of insulin  (HCC)   3. Hammertoe, bilateral      Plan:  Patient was evaluated and treated and all questions answered. -Discussed and educated patient on diabetic foot care, especially with  regards to the vascular, neurological and musculoskeletal systems.  -Stressed the importance of good glycemic control and the detriment of not  controlling glucose levels in relation to the foot. -Discussed supportive shoes at all times and checking feet regularly.  -Mechanically debrided all nails 1-5 bilateral using sterile nail nipper and filed with dremel without incident  -DM shoes ordered.  -Answered all patient questions -Patient to return  in 3 months for at risk foot care -Patient advised to call the office if any problems or questions arise in the meantime.   Louann Sjogren, DPM

## 2023-10-31 DIAGNOSIS — M1990 Unspecified osteoarthritis, unspecified site: Secondary | ICD-10-CM | POA: Diagnosis not present

## 2023-10-31 DIAGNOSIS — Z96649 Presence of unspecified artificial hip joint: Secondary | ICD-10-CM | POA: Diagnosis not present

## 2023-10-31 DIAGNOSIS — E782 Mixed hyperlipidemia: Secondary | ICD-10-CM | POA: Diagnosis not present

## 2023-10-31 DIAGNOSIS — Z7984 Long term (current) use of oral hypoglycemic drugs: Secondary | ICD-10-CM | POA: Diagnosis not present

## 2023-10-31 DIAGNOSIS — Z604 Social exclusion and rejection: Secondary | ICD-10-CM | POA: Diagnosis not present

## 2023-10-31 DIAGNOSIS — Z9181 History of falling: Secondary | ICD-10-CM | POA: Diagnosis not present

## 2023-10-31 DIAGNOSIS — I10 Essential (primary) hypertension: Secondary | ICD-10-CM | POA: Diagnosis not present

## 2023-10-31 DIAGNOSIS — D509 Iron deficiency anemia, unspecified: Secondary | ICD-10-CM | POA: Diagnosis not present

## 2023-10-31 DIAGNOSIS — Z952 Presence of prosthetic heart valve: Secondary | ICD-10-CM | POA: Diagnosis not present

## 2023-10-31 DIAGNOSIS — E46 Unspecified protein-calorie malnutrition: Secondary | ICD-10-CM | POA: Diagnosis not present

## 2023-10-31 DIAGNOSIS — S42021D Displaced fracture of shaft of right clavicle, subsequent encounter for fracture with routine healing: Secondary | ICD-10-CM | POA: Diagnosis not present

## 2023-10-31 DIAGNOSIS — Z556 Problems related to health literacy: Secondary | ICD-10-CM | POA: Diagnosis not present

## 2023-10-31 DIAGNOSIS — E538 Deficiency of other specified B group vitamins: Secondary | ICD-10-CM | POA: Diagnosis not present

## 2023-10-31 DIAGNOSIS — E1169 Type 2 diabetes mellitus with other specified complication: Secondary | ICD-10-CM | POA: Diagnosis not present

## 2023-11-12 DIAGNOSIS — E538 Deficiency of other specified B group vitamins: Secondary | ICD-10-CM | POA: Diagnosis not present

## 2023-11-12 DIAGNOSIS — I10 Essential (primary) hypertension: Secondary | ICD-10-CM | POA: Diagnosis not present

## 2023-11-12 DIAGNOSIS — R7301 Impaired fasting glucose: Secondary | ICD-10-CM | POA: Diagnosis not present

## 2023-11-13 DIAGNOSIS — S42021D Displaced fracture of shaft of right clavicle, subsequent encounter for fracture with routine healing: Secondary | ICD-10-CM | POA: Diagnosis not present

## 2023-11-13 DIAGNOSIS — Z9181 History of falling: Secondary | ICD-10-CM | POA: Diagnosis not present

## 2023-11-13 DIAGNOSIS — Z556 Problems related to health literacy: Secondary | ICD-10-CM | POA: Diagnosis not present

## 2023-11-13 DIAGNOSIS — Z952 Presence of prosthetic heart valve: Secondary | ICD-10-CM | POA: Diagnosis not present

## 2023-11-13 DIAGNOSIS — I10 Essential (primary) hypertension: Secondary | ICD-10-CM | POA: Diagnosis not present

## 2023-11-13 DIAGNOSIS — Z96649 Presence of unspecified artificial hip joint: Secondary | ICD-10-CM | POA: Diagnosis not present

## 2023-11-13 DIAGNOSIS — E1169 Type 2 diabetes mellitus with other specified complication: Secondary | ICD-10-CM | POA: Diagnosis not present

## 2023-11-13 DIAGNOSIS — E538 Deficiency of other specified B group vitamins: Secondary | ICD-10-CM | POA: Diagnosis not present

## 2023-11-13 DIAGNOSIS — Z7984 Long term (current) use of oral hypoglycemic drugs: Secondary | ICD-10-CM | POA: Diagnosis not present

## 2023-11-13 DIAGNOSIS — E782 Mixed hyperlipidemia: Secondary | ICD-10-CM | POA: Diagnosis not present

## 2023-11-13 DIAGNOSIS — D509 Iron deficiency anemia, unspecified: Secondary | ICD-10-CM | POA: Diagnosis not present

## 2023-11-13 DIAGNOSIS — E46 Unspecified protein-calorie malnutrition: Secondary | ICD-10-CM | POA: Diagnosis not present

## 2023-11-13 DIAGNOSIS — Z604 Social exclusion and rejection: Secondary | ICD-10-CM | POA: Diagnosis not present

## 2023-11-13 DIAGNOSIS — M1990 Unspecified osteoarthritis, unspecified site: Secondary | ICD-10-CM | POA: Diagnosis not present

## 2023-11-18 DIAGNOSIS — Z0000 Encounter for general adult medical examination without abnormal findings: Secondary | ICD-10-CM | POA: Diagnosis not present

## 2023-11-18 DIAGNOSIS — I10 Essential (primary) hypertension: Secondary | ICD-10-CM | POA: Diagnosis not present

## 2023-11-18 DIAGNOSIS — E1165 Type 2 diabetes mellitus with hyperglycemia: Secondary | ICD-10-CM | POA: Diagnosis not present

## 2023-11-18 DIAGNOSIS — M6281 Muscle weakness (generalized): Secondary | ICD-10-CM | POA: Diagnosis not present

## 2023-11-18 DIAGNOSIS — E538 Deficiency of other specified B group vitamins: Secondary | ICD-10-CM | POA: Diagnosis not present

## 2023-11-18 DIAGNOSIS — L84 Corns and callosities: Secondary | ICD-10-CM | POA: Diagnosis not present

## 2023-11-18 DIAGNOSIS — E782 Mixed hyperlipidemia: Secondary | ICD-10-CM | POA: Diagnosis not present

## 2023-12-06 ENCOUNTER — Other Ambulatory Visit: Payer: Self-pay

## 2023-12-06 ENCOUNTER — Other Ambulatory Visit: Payer: Medicare Other

## 2023-12-06 ENCOUNTER — Encounter: Payer: Self-pay | Admitting: Emergency Medicine

## 2023-12-06 ENCOUNTER — Ambulatory Visit
Admission: EM | Admit: 2023-12-06 | Discharge: 2023-12-06 | Disposition: A | Discharge status: Home / Self Care | Payer: Medicare Other | Attending: Nurse Practitioner | Admitting: Nurse Practitioner

## 2023-12-06 DIAGNOSIS — J029 Acute pharyngitis, unspecified: Secondary | ICD-10-CM

## 2023-12-06 LAB — POCT RAPID STREP A (OFFICE): Rapid Strep A Screen: NEGATIVE

## 2023-12-06 MED ORDER — FLUTICASONE PROPIONATE 50 MCG/ACT NA SUSP: 1.0000 | Freq: Every day | NASAL | 0 refills | Status: AC

## 2023-12-06 NOTE — ED Triage Notes (Signed)
Pt reports sore throat since last night. Has been taking otc medication with no change in symptoms.

## 2023-12-06 NOTE — ED Notes (Addendum)
Attempted to obtain strep swab. Unable to obtain. Pt biting at swab and tongue depressor. Provider aware.

## 2023-12-06 NOTE — Discharge Instructions (Signed)
You have a viral upper respiratory infection causing the store throat.  Symptoms should improve over the next week to 10 days.  If you develop chest pain or shortness of breath, go to the emergency room.  You declined a COVID-19 test today.    Start using the nasal spray to help with post nasal drainage and prevent the sore throat.  Some things that can make you feel better are: - Increased rest - Increasing fluid with water/sugar free electrolytes - Acetaminophen and ibuprofen as needed for fever/pain - Salt water gargling, chloraseptic spray and throat lozenges for sore throat - OTC guaifenesin (Mucinex) 600 mg twice daily for congestion - Saline sinus flushes or a neti pot - Humidifying the air

## 2023-12-06 NOTE — ED Provider Notes (Signed)
[facility]    CSN: [id number] Arrival date & time: 12/06/23  4401      History   Chief Complaint Chief Complaint  Patient presents with   Sore Throat    HPI David Herrera is a 89 y.o. male.   Patient presents today with 1 day history of sore throat.  A family member is present in the room who helps provide history reports he was also coughing yesterday, however patient denies cough.  Patient denies fever, body aches or chills, shortness of breath or chest pain, runny or stuffy nose, headache, ear pain, abdominal pain, nausea/vomiting, or diarrhea.  The family member reports he did not eat breakfast this morning because of his throat hurting.  Patient denies known sick contacts, however the family member present in the room is coughing and blowing his nose.  Has taken Robitussin and ibuprofen for symptoms without benefit.    Past Medical History:  Diagnosis Date   Basal cell carcinoma 06/14/2003   left flank   Basal cell carcinoma 06/14/2003   right upper back   Basal cell carcinoma 06/14/2003   left arm   Basal cell carcinoma 01/13/2017   left back of neck/hairline   Basal cell carcinoma 06/02/2015   right leg   Basal cell carcinoma 04/08/2018   left chest   Diabetes mellitus without complication (HCC)    DJD (degenerative joint disease) of hip    right   HOH (hard of hearing)    Hypertension    Melanoma (HCC) 06/14/2003   let outer back   Rosacea    Rosacea    Vertigo     Patient Active Problem List   Diagnosis Date Noted   Malnutrition of moderate degree 08/29/2023   Dehydration 08/24/2023   Pressure injury of skin 08/24/2023   Anemia of chronic disease 08/24/2023   Hypokalemia 08/24/2023   Hyponatremia 08/24/2023   Type 2 diabetes mellitus with hyperglycemia (HCC) 08/24/2023   Mixed hyperlipidemia 08/24/2023   Generalized weakness 08/23/2023   Difficulty walking 09/22/2013   Unstable balance 09/22/2013   Weakness of right leg  09/22/2013   Essential hypertension    Osteoarthritis of hip     Past Surgical History:  Procedure Laterality Date   CATARACT EXTRACTION W/PHACO Right 06/03/2014   Procedure: CATARACT EXTRACTION PHACO AND INTRAOCULAR LENS PLACEMENT (IOC);  Surgeon: Gemma Payor, MD;  Location: [facility];  Service: Ophthalmology;  Laterality: Right;  CDE:16.07   CATARACT EXTRACTION W/PHACO Left 06/21/2014   Procedure: CATARACT EXTRACTION PHACO AND INTRAOCULAR LENS PLACEMENT (IOC);  Surgeon: Gemma Payor, MD;  Location: [facility];  Service: Ophthalmology;  Laterality: Left;  CDE:  6.80   NO PAST SURGERIES     TOTAL HIP ARTHROPLASTY Right 09/02/2013   Procedure: TOTAL HIP ARTHROPLASTY;  Surgeon: Loreta Ave, MD;  Location: [facility];  Service: Orthopedics;  Laterality: Right;       Home Medications    Prior to Admission medications   Medication Sig Start Date End Date Taking? Authorizing Provider  fluticasone (FLONASE) 50 MCG/ACT nasal spray Place 1 spray into both nostrils daily. 12/06/23  Yes Valentino Nose, NP  acetaminophen (TYLENOL) 500 MG tablet Take 500 mg by mouth 2 (two) times daily as needed for mild pain.    [provider]  ascorbic acid (VITAMIN C) 500 MG tablet Take 1 tablet (500 mg total) by mouth 2 (two) times daily. 09/03/23   Vassie Loll, MD  cyanocobalamin (VITAMIN B12) 1000 MCG/ML injection Weekly X  3 doses (every Tuesday); then monthly injections. 09/04/23   Vassie Loll, MD  hydrochlorothiazide (HYDRODIURIL) 25 MG tablet Take 25 mg by mouth 2 (two) times daily.    [provider]  losartan (COZAAR) 25 MG tablet Take 25 mg by mouth daily. 04/07/21   [provider]  metFORMIN (GLUCOPHAGE) 1000 MG tablet Take 500 mg by mouth 2 (two) times daily with a meal. 10/29/16   [provider]  Multiple Vitamin (MULTIVITAMIN WITH MINERALS) TABS tablet Take 1 tablet by mouth daily. 09/04/23   Vassie Loll, MD  nutrition supplement, JUVEN, (JUVEN) PACK Take 1 packet by  mouth 2 (two) times daily between meals. 09/03/23   Vassie Loll, MD  simvastatin (ZOCOR) 20 MG tablet Take 10 mg by mouth in the morning and at bedtime.    [provider]  zinc sulfate 220 (50 Zn) MG capsule Take 1 capsule (220 mg total) by mouth daily. 09/04/23   Vassie Loll, MD    Family History Family History  Problem Relation Age of Onset   Diabetes Unknown     Social History Social History   Tobacco Use   Smoking status: Never   Smokeless tobacco: Never  Substance Use Topics   Alcohol use: No   Drug use: No     Allergies   Patient has no known allergies.   Review of Systems Review of Systems Per HPI  Physical Exam Triage Vital Signs ED Triage Vitals  Encounter Vitals Group     BP 12/06/23 1013 119/75     Systolic BP Percentile --      Diastolic BP Percentile --      Pulse Rate 12/06/23 1013 81     Resp 12/06/23 1013 20     Temp 12/06/23 1013 98.1 F (36.7 C)     Temp Source 12/06/23 1013 Oral     SpO2 12/06/23 1013 95 %     Weight --      Height --      Head Circumference --      Peak Flow --      Pain Score 12/06/23 1015 8     Pain Loc --      Pain Education --      Exclude from Growth Chart --    No data found.  Updated Vital Signs BP 119/75 (BP Location: Right Arm)   Pulse 81   Temp 98.1 F (36.7 C) (Oral)   Resp 20   SpO2 95%   Visual Acuity Right Eye Distance:   Left Eye Distance:   Bilateral Distance:    Right Eye Near:   Left Eye Near:    Bilateral Near:     Physical Exam Vitals and nursing note reviewed.  Constitutional:      General: He is not in acute distress.    Appearance: Normal appearance. He is not ill-appearing or toxic-appearing.  HENT:     Head: Normocephalic and atraumatic.     Right Ear: Tympanic membrane, ear canal and external ear normal. No drainage, swelling or tenderness. No middle ear effusion. Tympanic membrane is not erythematous.     Left Ear: Tympanic membrane, ear canal and external ear  normal. No drainage, swelling or tenderness.  No middle ear effusion. Tympanic membrane is not erythematous.     Nose: Congestion and rhinorrhea present.     Mouth/Throat:     Mouth: Mucous membranes are moist.     Pharynx: Oropharynx is clear. Posterior oropharyngeal erythema present. No oropharyngeal  exudate.     Comments: Post nasal drainage Eyes:     General: No scleral icterus.    Extraocular Movements: Extraocular movements intact.  Cardiovascular:     Rate and Rhythm: Normal rate and regular rhythm.  Pulmonary:     Effort: Pulmonary effort is normal. No respiratory distress.     Breath sounds: Normal breath sounds. No wheezing, rhonchi or rales.  Abdominal:     General: Abdomen is flat. Bowel sounds are normal. There is no distension.     Palpations: Abdomen is soft.  Musculoskeletal:     Cervical back: Normal range of motion and neck supple.  Lymphadenopathy:     Cervical: No cervical adenopathy.  Skin:    General: Skin is warm and dry.     Coloration: Skin is not jaundiced or pale.     Findings: No erythema or rash.  Neurological:     Mental Status: He is alert and oriented to person, place, and time.     Motor: No weakness.  Psychiatric:        Behavior: Behavior is cooperative.      UC Treatments / Results  Labs (all labs ordered are listed, but only abnormal results are displayed) Labs Reviewed  POCT RAPID STREP A (OFFICE)    EKG   Radiology No results found.  Procedures Procedures (including critical care time)  Medications Ordered in UC Medications - No data to display  Initial Impression / Assessment and Plan / UC Course  I have reviewed the triage vital signs and the nursing notes.  Pertinent labs & imaging results that were available during my care of the patient were reviewed by me and considered in my medical decision making (see chart for details).   Patient is well-appearing, normotensive, afebrile, not tachycardic, not tachypneic,  oxygenating well on room air.    1. Viral pharyngitis Vitals and exam are reassuring Suspect viral pharyngitis Patient declines COVID-19 testing today Treat with Flonase nasal spray to help with postnasal drainage, over-the-counter sore throat lozenges Return and ER precautions discussed  The patient was given the opportunity to ask questions.  All questions answered to their satisfaction.  The patient is in agreement to this plan.    Final Clinical Impressions(s) / UC Diagnoses   Final diagnoses:  Viral pharyngitis     Discharge Instructions      You have a viral upper respiratory infection causing the store throat.  Symptoms should improve over the next week to 10 days.  If you develop chest pain or shortness of breath, go to the emergency room.  You declined a COVID-19 test today.    Start using the nasal spray to help with post nasal drainage and prevent the sore throat.  Some things that can make you feel better are: - Increased rest - Increasing fluid with water/sugar free electrolytes - Acetaminophen and ibuprofen as needed for fever/pain - Salt water gargling, chloraseptic spray and throat lozenges for sore throat - OTC guaifenesin (Mucinex) 600 mg twice daily for congestion - Saline sinus flushes or a neti pot - Humidifying the air    ED Prescriptions     Medication Sig Dispense Auth. Provider   fluticasone (FLONASE) 50 MCG/ACT nasal spray Place 1 spray into both nostrils daily. 16 g Valentino Nose, NP      PDMP not reviewed this encounter.   Valentino Nose, NP 12/06/23 1145

## 2023-12-17 DIAGNOSIS — E538 Deficiency of other specified B group vitamins: Secondary | ICD-10-CM | POA: Diagnosis not present

## 2024-01-21 DIAGNOSIS — E538 Deficiency of other specified B group vitamins: Secondary | ICD-10-CM | POA: Diagnosis not present

## 2024-01-27 ENCOUNTER — Ambulatory Visit (INDEPENDENT_AMBULATORY_CARE_PROVIDER_SITE_OTHER): Payer: Medicare Other | Admitting: Podiatry

## 2024-01-27 ENCOUNTER — Ambulatory Visit: Payer: Medicare Other

## 2024-01-27 ENCOUNTER — Encounter: Payer: Self-pay | Admitting: Podiatry

## 2024-01-27 DIAGNOSIS — B351 Tinea unguium: Secondary | ICD-10-CM | POA: Diagnosis not present

## 2024-01-27 DIAGNOSIS — M79675 Pain in left toe(s): Secondary | ICD-10-CM | POA: Diagnosis not present

## 2024-01-27 DIAGNOSIS — E1142 Type 2 diabetes mellitus with diabetic polyneuropathy: Secondary | ICD-10-CM

## 2024-01-27 DIAGNOSIS — M79674 Pain in right toe(s): Secondary | ICD-10-CM

## 2024-01-27 NOTE — Progress Notes (Signed)
  Subjective:  Patient ID: David Herrera, male    DOB: 08-Aug-1933,   MRN: [id number]  No chief complaint on file.   89 y.o. male presents for concern of thickened elongated and painful nails that are difficult to trim. Requesting to have them trimmed today. Relates burning and tingling in their feet. Patient is diabetic and last A1c was  Lab Results  Component Value Date   HGBA1C 6.9 (H) 08/24/2023   .   PCP:  Shelva Majestic T, DO    . Denies any other pedal complaints. Denies n/v/f/c.   Past Medical History:  Diagnosis Date   Basal cell carcinoma 06/14/2003   left flank   Basal cell carcinoma 06/14/2003   right upper back   Basal cell carcinoma 06/14/2003   left arm   Basal cell carcinoma 01/13/2017   left back of neck/hairline   Basal cell carcinoma 06/02/2015   right leg   Basal cell carcinoma 04/08/2018   left chest   Diabetes mellitus without complication (HCC)    DJD (degenerative joint disease) of hip    right   HOH (hard of hearing)    Hypertension    Melanoma (HCC) 06/14/2003   let outer back   Rosacea    Rosacea    Vertigo     Objective:  Physical Exam: Vascular: DP/PT pulses 2/4 bilateral. CFT <3 seconds. Absent hair growth on digits. Edema noted to bilateral lower extremities. Xerosis noted bilaterally.  Skin. No lacerations or abrasions bilateral feet. Nails 1-5 bilateral  are thickened discolored and elongated with subungual debris.  Musculoskeletal: MMT 5/5 bilateral lower extremities in DF, PF, Inversion and Eversion. Deceased ROM in DF of ankle joint. Hammered digits 2-5. Mild prominence of styloid process of right fifth metatarsal. No open lesions.  Neurological: Sensation intact to light touch. Protective sensation diminished bilateral.    Assessment:   1. Pain due to onychomycosis of toenails of both feet   2. Type 2 diabetes mellitus with diabetic polyneuropathy, without long-term current use of insulin (HCC)      Plan:  Patient was  evaluated and treated and all questions answered. -Discussed and educated patient on diabetic foot care, especially with  regards to the vascular, neurological and musculoskeletal systems.  -Stressed the importance of good glycemic control and the detriment of not  controlling glucose levels in relation to the foot. -Discussed supportive shoes at all times and checking feet regularly.  -Mechanically debrided all nails 1-5 bilateral using sterile nail nipper and filed with dremel without incident  -DM shoes ordered.  -Answered all patient questions -Patient to return  in 3 months for at risk foot care -Patient advised to call the office if any problems or questions arise in the meantime.   Louann Sjogren, DPM

## 2024-01-27 NOTE — Progress Notes (Signed)
Patient and son were here for DM shoes  I explained treating Dr. Stann Mainland need to sign off on [facility] ppw, Son stated he will be seeing a new Dr. But could not tell me when, although new PA-C is in Dr Scharlene Gloss office. Dr hall will have to sign off he asked me to fax the paperwork now I explained that I still need to see him to create the order in [employer] and that will populate the ppw and then be sent to that office. He said it was too much of a hassle and left David Herrera CPed, CFo, CFm

## 2024-04-27 ENCOUNTER — Ambulatory Visit: Payer: Medicare Other | Admitting: Podiatry

## 2024-05-04 ENCOUNTER — Encounter: Payer: Self-pay | Admitting: Podiatry

## 2024-05-04 ENCOUNTER — Ambulatory Visit: Admitting: Podiatry

## 2024-05-04 DIAGNOSIS — M79675 Pain in left toe(s): Secondary | ICD-10-CM | POA: Diagnosis not present

## 2024-05-04 DIAGNOSIS — B351 Tinea unguium: Secondary | ICD-10-CM

## 2024-05-04 DIAGNOSIS — M79674 Pain in right toe(s): Secondary | ICD-10-CM

## 2024-05-04 DIAGNOSIS — E1142 Type 2 diabetes mellitus with diabetic polyneuropathy: Secondary | ICD-10-CM | POA: Diagnosis not present

## 2024-05-04 NOTE — Progress Notes (Signed)
  Subjective:  Patient ID: David Herrera, male    DOB: May 14, 1933,   MRN: [id number]  No chief complaint on file.   89 y.o. male presents for concern of thickened elongated and painful nails that are difficult to trim. Requesting to have them trimmed today. Relates burning and tingling in their feet. Patient is diabetic and last A1c was  Lab Results  Component Value Date   HGBA1C 6.9 (H) 08/24/2023   .   PCP:  Tomi Foy T, DO    . Denies any other pedal complaints. Denies n/v/f/c.   Past Medical History:  Diagnosis Date   Basal cell carcinoma 06/14/2003   left flank   Basal cell carcinoma 06/14/2003   right upper back   Basal cell carcinoma 06/14/2003   left arm   Basal cell carcinoma 01/13/2017   left back of neck/hairline   Basal cell carcinoma 06/02/2015   right leg   Basal cell carcinoma 04/08/2018   left chest   Diabetes mellitus without complication (HCC)    DJD (degenerative joint disease) of hip    right   HOH (hard of hearing)    Hypertension    Melanoma (HCC) 06/14/2003   let outer back   Rosacea    Rosacea    Vertigo     Objective:  Physical Exam: Vascular: DP/PT pulses 2/4 bilateral. CFT <3 seconds. Absent hair growth on digits. Edema noted to bilateral lower extremities. Xerosis noted bilaterally.  Skin. No lacerations or abrasions bilateral feet. Nails 1-5 bilateral  are thickened discolored and elongated with subungual debris.  Musculoskeletal: MMT 5/5 bilateral lower extremities in DF, PF, Inversion and Eversion. Deceased ROM in DF of ankle joint. Hammered digits 2-5. Mild prominence of styloid process of right fifth metatarsal. No open lesions.  Neurological: Sensation intact to light touch. Protective sensation diminished bilateral.    Assessment:   1. Pain due to onychomycosis of toenails of both feet   2. Type 2 diabetes mellitus with diabetic polyneuropathy, without long-term current use of insulin  (HCC)       Plan:  Patient was  evaluated and treated and all questions answered. -Discussed and educated patient on diabetic foot care, especially with  regards to the vascular, neurological and musculoskeletal systems.  -Stressed the importance of good glycemic control and the detriment of not  controlling glucose levels in relation to the foot. -Discussed supportive shoes at all times and checking feet regularly.  -Mechanically debrided all nails 1-5 bilateral using sterile nail nipper and filed with dremel without incident   -Answered all patient questions -Patient to return  in 3 months for at risk foot care -Patient advised to call the office if any problems or questions arise in the meantime.   Jennefer Moats, DPM

## 2024-05-12 DIAGNOSIS — E538 Deficiency of other specified B group vitamins: Secondary | ICD-10-CM | POA: Diagnosis not present

## 2024-05-12 DIAGNOSIS — I10 Essential (primary) hypertension: Secondary | ICD-10-CM | POA: Diagnosis not present

## 2024-05-12 DIAGNOSIS — R7301 Impaired fasting glucose: Secondary | ICD-10-CM | POA: Diagnosis not present

## 2024-05-18 ENCOUNTER — Other Ambulatory Visit (HOSPITAL_COMMUNITY): Payer: Self-pay | Admitting: Nurse Practitioner

## 2024-05-18 DIAGNOSIS — M6281 Muscle weakness (generalized): Secondary | ICD-10-CM | POA: Diagnosis not present

## 2024-05-18 DIAGNOSIS — E1165 Type 2 diabetes mellitus with hyperglycemia: Secondary | ICD-10-CM | POA: Diagnosis not present

## 2024-05-18 DIAGNOSIS — L84 Corns and callosities: Secondary | ICD-10-CM | POA: Diagnosis not present

## 2024-05-18 DIAGNOSIS — R062 Wheezing: Secondary | ICD-10-CM | POA: Diagnosis not present

## 2024-05-18 DIAGNOSIS — Z79899 Other long term (current) drug therapy: Secondary | ICD-10-CM | POA: Diagnosis not present

## 2024-05-18 DIAGNOSIS — I10 Essential (primary) hypertension: Secondary | ICD-10-CM | POA: Diagnosis not present

## 2024-05-18 DIAGNOSIS — E538 Deficiency of other specified B group vitamins: Secondary | ICD-10-CM | POA: Diagnosis not present

## 2024-05-18 DIAGNOSIS — E782 Mixed hyperlipidemia: Secondary | ICD-10-CM | POA: Diagnosis not present

## 2024-05-21 ENCOUNTER — Ambulatory Visit (HOSPITAL_COMMUNITY)
Admission: RE | Admit: 2024-05-21 | Discharge: 2024-05-21 | Disposition: A | Discharge status: Home / Self Care | Source: Ambulatory Visit | Attending: Nurse Practitioner | Admitting: Nurse Practitioner

## 2024-05-21 DIAGNOSIS — R0602 Shortness of breath: Secondary | ICD-10-CM | POA: Diagnosis not present

## 2024-05-21 DIAGNOSIS — R062 Wheezing: Secondary | ICD-10-CM

## 2024-07-24 ENCOUNTER — Other Ambulatory Visit (HOSPITAL_COMMUNITY): Payer: Self-pay

## 2024-07-24 ENCOUNTER — Ambulatory Visit (HOSPITAL_COMMUNITY)
Admission: RE | Admit: 2024-07-24 | Discharge: 2024-07-24 | Disposition: A | Discharge status: Home / Self Care | Source: Ambulatory Visit

## 2024-07-24 DIAGNOSIS — E1165 Type 2 diabetes mellitus with hyperglycemia: Secondary | ICD-10-CM | POA: Diagnosis not present

## 2024-07-24 DIAGNOSIS — M25551 Pain in right hip: Secondary | ICD-10-CM | POA: Diagnosis not present

## 2024-07-24 DIAGNOSIS — M160 Bilateral primary osteoarthritis of hip: Secondary | ICD-10-CM | POA: Diagnosis not present

## 2024-07-24 DIAGNOSIS — Z96641 Presence of right artificial hip joint: Secondary | ICD-10-CM | POA: Diagnosis not present

## 2024-07-24 DIAGNOSIS — L309 Dermatitis, unspecified: Secondary | ICD-10-CM | POA: Diagnosis not present

## 2024-07-24 DIAGNOSIS — M25552 Pain in left hip: Secondary | ICD-10-CM | POA: Diagnosis not present

## 2024-08-10 ENCOUNTER — Ambulatory Visit: Admitting: Podiatry

## 2024-08-19 DIAGNOSIS — E1165 Type 2 diabetes mellitus with hyperglycemia: Secondary | ICD-10-CM | POA: Diagnosis not present

## 2024-09-07 ENCOUNTER — Encounter: Payer: Self-pay | Admitting: Podiatry

## 2024-09-07 ENCOUNTER — Ambulatory Visit (INDEPENDENT_AMBULATORY_CARE_PROVIDER_SITE_OTHER): Admitting: Podiatry

## 2024-09-07 DIAGNOSIS — M79674 Pain in right toe(s): Secondary | ICD-10-CM

## 2024-09-07 DIAGNOSIS — M79675 Pain in left toe(s): Secondary | ICD-10-CM

## 2024-09-07 DIAGNOSIS — B351 Tinea unguium: Secondary | ICD-10-CM | POA: Diagnosis not present

## 2024-09-07 DIAGNOSIS — E1142 Type 2 diabetes mellitus with diabetic polyneuropathy: Secondary | ICD-10-CM | POA: Diagnosis not present

## 2024-09-07 NOTE — Progress Notes (Signed)
  Subjective:  Patient ID: David Herrera, male    DOB: 04-Nov-1933,   MRN: [id number]  Chief Complaint  Patient presents with   Diabetes    Toenails  Saw Dr. Zachary's PA - 1 month ago; A1c - 6.9    89 y.o. male presents for concern of thickened elongated and painful nails that are difficult to trim. Requesting to have them trimmed today. Relates burning and tingling in their feet. Patient is diabetic and last A1c was  Lab Results  Component Value Date   HGBA1C 6.9 (H) 08/24/2023   .   PCP:  Shona Hussar T, DO    . Denies any other pedal complaints. Denies n/v/f/c.   Past Medical History:  Diagnosis Date   Basal cell carcinoma 06/14/2003   left flank   Basal cell carcinoma 06/14/2003   right upper back   Basal cell carcinoma 06/14/2003   left arm   Basal cell carcinoma 01/13/2017   left back of neck/hairline   Basal cell carcinoma 06/02/2015   right leg   Basal cell carcinoma 04/08/2018   left chest   Diabetes mellitus without complication (HCC)    DJD (degenerative joint disease) of hip    right   HOH (hard of hearing)    Hypertension    Melanoma (HCC) 06/14/2003   let outer back   Rosacea    Rosacea    Vertigo     Objective:  Physical Exam: Vascular: DP/PT pulses 2/4 bilateral. CFT <3 seconds. Absent hair growth on digits. Edema noted to bilateral lower extremities. Xerosis noted bilaterally.  Skin. No lacerations or abrasions bilateral feet. Nails 1-5 bilateral  are thickened discolored and elongated with subungual debris.  Musculoskeletal: MMT 5/5 bilateral lower extremities in DF, PF, Inversion and Eversion. Deceased ROM in DF of ankle joint. Hammered digits 2-5. Mild prominence of styloid process of right fifth metatarsal. No open lesions.  Neurological: Sensation intact to light touch. Protective sensation diminished bilateral.    Assessment:   1. Pain due to onychomycosis of toenails of both feet   2. Type 2 diabetes mellitus with diabetic  polyneuropathy, without long-term current use of insulin  (HCC)        Plan:  Patient was evaluated and treated and all questions answered. -Discussed and educated patient on diabetic foot care, especially with  regards to the vascular, neurological and musculoskeletal systems.  -Stressed the importance of good glycemic control and the detriment of not  controlling glucose levels in relation to the foot. -Discussed supportive shoes at all times and checking feet regularly.  -Mechanically debrided all nails 1-5 bilateral using sterile nail nipper and filed with dremel without incident   -Answered all patient questions -Patient to return  in 3 months for at risk foot care -Patient advised to call the office if any problems or questions arise in the meantime.   Asberry Failing, DPM

## 2024-10-20 DIAGNOSIS — R300 Dysuria: Secondary | ICD-10-CM | POA: Diagnosis not present

## 2024-10-21 DIAGNOSIS — R413 Other amnesia: Secondary | ICD-10-CM | POA: Diagnosis not present

## 2024-10-21 DIAGNOSIS — Z0000 Encounter for general adult medical examination without abnormal findings: Secondary | ICD-10-CM | POA: Diagnosis not present

## 2024-10-21 DIAGNOSIS — Z0001 Encounter for general adult medical examination with abnormal findings: Secondary | ICD-10-CM | POA: Diagnosis not present

## 2024-10-21 DIAGNOSIS — R259 Unspecified abnormal involuntary movements: Secondary | ICD-10-CM | POA: Diagnosis not present

## 2024-11-11 ENCOUNTER — Other Ambulatory Visit (HOSPITAL_COMMUNITY): Payer: Self-pay | Admitting: Nurse Practitioner

## 2024-11-11 DIAGNOSIS — R4189 Other symptoms and signs involving cognitive functions and awareness: Secondary | ICD-10-CM

## 2024-11-13 ENCOUNTER — Ambulatory Visit (HOSPITAL_COMMUNITY)
Admission: RE | Admit: 2024-11-13 | Discharge: 2024-11-13 | Disposition: A | Discharge status: Home / Self Care | Source: Ambulatory Visit | Attending: Nurse Practitioner | Admitting: Nurse Practitioner

## 2024-11-13 DIAGNOSIS — R4189 Other symptoms and signs involving cognitive functions and awareness: Secondary | ICD-10-CM | POA: Diagnosis present

## 2024-11-13 DIAGNOSIS — R9082 White matter disease, unspecified: Secondary | ICD-10-CM | POA: Diagnosis not present

## 2024-12-07 ENCOUNTER — Ambulatory Visit: Admitting: Podiatry

## 2024-12-15 ENCOUNTER — Ambulatory Visit: Admitting: Diagnostic Neuroimaging

## 2024-12-15 ENCOUNTER — Encounter: Payer: Self-pay | Admitting: Diagnostic Neuroimaging

## 2024-12-15 VITALS — BP 106/76 | HR 87 | Ht 65.5 in | Wt 146.8 lb

## 2024-12-15 DIAGNOSIS — R413 Other amnesia: Secondary | ICD-10-CM | POA: Diagnosis not present

## 2024-12-15 NOTE — Progress Notes (Signed)
 [facility]  PATIENT: David Herrera DOB: January 12, 1933  REFERRING CLINICIAN: Joeann Browning, FNP HISTORY FROM: patient, son, caregiver REASON FOR VISIT: new consult   HISTORICAL  CHIEF COMPLAINT:  Chief Complaint  Patient presents with   New Patient (Initial Visit)    Rm7. Cognitive changes, started worsening about 6 months ago. ATN labs completed on 11/20. Difficulty ambulating, happened 1 year ago was in SNF, improved, now difficult again. Walker use when ambulating, but walking much less now. Has home health PT & OT w/ [facility]. Family notices him sundowning. Patient has started seeing things, he says his things are coming up missing. Failed Rexulti, started Celexa 3 weeks ago. Not sleeping much, patient gets upset at times when these discussions are happening.    HISTORY OF PRESENT ILLNESS:   89 year old male here for evaluation of memory and cognitive decline.  July 2024 patient was living alone, fairly independently.  He had a fall resulting in collarbone fracture.  Following this he moved in with his son to recover.  In August son noted that he was getting progressively weaker, gait difficulty, memory and confusion problems.  He went to the hospital and was diagnosed with generalized weakness, deconditioning, B12 deficiency and protein calorie malnutrition.  He was also noted to have some pressure sores.  He was discharged to skilled nursing facility.  He spent several months recovering and rehabilitating.  Eventually he was able to move back home with full-time caregivers.  Since then he has continued to have progressive gait and cognitive decline.  He is losing things, paranoid about different people and his possessions, seeing things, having short-term memory loss, language difficulty and significant decline in his ADLs.  This progressive worsening over time.    REVIEW OF SYSTEMS: Full 14 system review of systems performed and negative with exception  of: as per HPI.  ALLERGIES: Allergies[1]  HOME MEDICATIONS: Outpatient Medications Prior to Visit  Medication Sig Dispense Refill   acetaminophen  (TYLENOL ) 500 MG tablet Take 500 mg by mouth 2 (two) times daily as needed for mild pain.     ascorbic acid  (VITAMIN C ) 500 MG tablet Take 1 tablet (500 mg total) by mouth 2 (two) times daily.     citalopram (CELEXA) 20 MG tablet Take 1 tablet every day by oral route.     cyanocobalamin  (VITAMIN B12) 1000 MCG/ML injection Weekly X 3 doses (every Tuesday); then monthly injections. 9 mL 0   fluticasone  (FLONASE ) 50 MCG/ACT nasal spray Place 1 spray into both nostrils daily. 16 g 0   hydrochlorothiazide  (HYDRODIURIL ) 25 MG tablet Take 25 mg by mouth 2 (two) times daily.     losartan  (COZAAR ) 25 MG tablet Take 25 mg by mouth daily.     Melatonin 10 MG TABS Take 10 mg by mouth at bedtime.     metFORMIN (GLUCOPHAGE) 1000 MG tablet Take 500 mg by mouth 2 (two) times daily with a meal.     Multiple Vitamin (MULTIVITAMIN WITH MINERALS) TABS tablet Take 1 tablet by mouth daily.     nutrition supplement, JUVEN, (JUVEN) PACK Take 1 packet by mouth 2 (two) times daily between meals.     simvastatin  (ZOCOR ) 20 MG tablet Take 10 mg by mouth in the morning and at bedtime.     zinc  sulfate 220 (50 Zn) MG capsule Take 1 capsule (220 mg total) by mouth daily.     No facility-administered medications prior to visit.    PAST MEDICAL HISTORY: Past Medical History:  Diagnosis Date   Basal cell carcinoma 06/14/2003   left flank   Basal cell carcinoma 06/14/2003   right upper back   Basal cell carcinoma 06/14/2003   left arm   Basal cell carcinoma 01/13/2017   left back of neck/hairline   Basal cell carcinoma 06/02/2015   right leg   Basal cell carcinoma 04/08/2018   left chest   Diabetes mellitus without complication (HCC)    DJD (degenerative joint disease) of hip    right   HOH (hard of hearing)    Hypertension    Melanoma (HCC) 06/14/2003   let  outer back   Rosacea    Rosacea    Vertigo     PAST SURGICAL HISTORY: Past Surgical History:  Procedure Laterality Date   CATARACT EXTRACTION W/PHACO Right 06/03/2014   Procedure: CATARACT EXTRACTION PHACO AND INTRAOCULAR LENS PLACEMENT (IOC);  Surgeon: Cherene Mania, MD;  Location: [facility];  Service: Ophthalmology;  Laterality: Right;  CDE:16.07   CATARACT EXTRACTION W/PHACO Left 06/21/2014   Procedure: CATARACT EXTRACTION PHACO AND INTRAOCULAR LENS PLACEMENT (IOC);  Surgeon: Cherene Mania, MD;  Location: [facility];  Service: Ophthalmology;  Laterality: Left;  CDE:  6.80   NO PAST SURGERIES     TOTAL HIP ARTHROPLASTY Right 09/02/2013   Procedure: TOTAL HIP ARTHROPLASTY;  Surgeon: Toribio JULIANNA Chancy, MD;  Location: [facility];  Service: Orthopedics;  Laterality: Right;    FAMILY HISTORY: Family History  Problem Relation Age of Onset   Diabetes Unknown     SOCIAL HISTORY: Social History   Socioeconomic History   Marital status: Single    Spouse name: Not on file   Number of children: Not on file   Years of education: Not on file   Highest education level: Not on file  Occupational History   Not on file  Tobacco Use   Smoking status: Never   Smokeless tobacco: Never  Substance and Sexual Activity   Alcohol use: No   Drug use: No   Sexual activity: Yes    Birth control/protection: None  Other Topics Concern   Not on file  Social History Narrative   Not on file   Social Drivers of Health   Tobacco Use: Low Risk (12/15/2024)   Patient History    Smoking Tobacco Use: Never    Smokeless Tobacco Use: Never    Passive Exposure: Not on file  Financial Resource Strain: Not on file  Food Insecurity: No Food Insecurity (08/24/2023)   Hunger Vital Sign    Worried About Running Out of Food in the Last Year: Never true    Ran Out of Food in the Last Year: Never true  Transportation Needs: No Transportation Needs (08/24/2023)   PRAPARE - Administrator, Civil Service (Medical): No     Lack of Transportation (Non-Medical): No  Physical Activity: Not on file  Stress: Not on file  Social Connections: Not on file  Intimate Partner Violence: Not At Risk (08/24/2023)   Humiliation, Afraid, Rape, and Kick questionnaire    Fear of Current or Ex-Partner: No    Emotionally Abused: No    Physically Abused: No    Sexually Abused: No  Depression (PHQ2-9): Not on file  Alcohol Screen: Not on file  Housing: Low Risk (08/24/2023)   Housing    Last Housing Risk Score: 0  Utilities: Not At Risk (08/24/2023)   AHC Utilities    Threatened with loss of utilities: No  Health Literacy: Not on file  PHYSICAL EXAM  GENERAL EXAM/CONSTITUTIONAL: Vitals:  Vitals:   12/15/24 1505  BP: 106/76  Pulse: 87  SpO2: 99%  Weight: 146 lb 12.8 oz (66.6 kg)  Height: 5' 5.5 (1.664 m)   Body mass index is 24.06 kg/m. Wt Readings from Last 3 Encounters:  12/15/24 146 lb 12.8 oz (66.6 kg)  08/24/23 157 lb 3 oz (71.3 kg)  08/09/23 156 lb 6 oz (70.9 kg)   Patient is in no distress; well developed, nourished and groomed; neck is supple  CARDIOVASCULAR: Examination of carotid arteries is normal; no carotid bruits Regular rate and rhythm, no murmurs Examination of peripheral vascular system by observation and palpation is normal  EYES: Ophthalmoscopic exam of optic discs and posterior segments is normal; no papilledema or hemorrhages No results found.  MUSCULOSKELETAL: Gait, strength, tone, movements noted in Neurologic exam below  NEUROLOGIC: MENTAL STATUS:     12/15/2024    3:00 PM  MMSE - Mini Mental State Exam  Orientation to time 0  Orientation to Place 0  Registration 2  Attention/ Calculation 1  Recall 0  Language- name 2 objects 2  Language- repeat 0  Language- follow 3 step command 1  Language- read & follow direction 0  Write a sentence 0  Copy design 0  Total score 6       No data to display           awake, alert, oriented to person DECR RECALL DECR  attention and concentration DECR FLUENCY AND COMPREHENSION  CRANIAL NERVE:  2nd - no papilledema on fundoscopic exam 2nd, 3rd, 4th, 6th - pupils equal and reactive to light, visual fields full to confrontation, extraocular muscles intact, no nystagmus; OCULOMOTOR APRAXIA 5th - facial sensation symmetric 7th - facial strength symmetric 8th - hearing intact 9th - palate elevates symmetrically, uvula midline 11th - shoulder shrug symmetric 12th - tongue protrusion midline  MOTOR:  normal bulk and tone, 4/5 strength in the BUE, BLE; EXCEPT BILATERAL  DORSIFLEXION 3/5  SENSORY:  normal and symmetric to light touch, temperature, vibration; DECR / ABSENT IN TOES / FEET  COORDINATION:  finger-nose-finger, fine finger movements normal  REFLEXES:  deep tendon reflexes 1+ and symmetric; POSITIVE SNOUT, MYERSON, ROOTING REFLEXES  GAIT/STATION:  IN WHEEL CHAIR; 2 PERSON ASSIST TO STAND; VERY UNSTEADY; BARELY ABLE TO TAKE 1-2 STEPS     DIAGNOSTIC DATA (LABS, IMAGING, TESTING) - I reviewed patient records, labs, notes, testing and imaging myself where available.  Lab Results  Component Value Date   WBC 4.7 08/26/2023   HGB 11.9 (L) 08/26/2023   HCT 36.5 (L) 08/26/2023   MCV 90.6 08/26/2023   PLT 213 08/26/2023      Component Value Date/Time   NA 135 08/26/2023 0408   K 3.8 08/26/2023 0408   CL 103 08/26/2023 0408   CO2 25 08/26/2023 0408   GLUCOSE 113 (H) 08/26/2023 0408   BUN 13 08/26/2023 0408   CREATININE 0.71 08/31/2023 0348   CALCIUM 8.6 (L) 08/26/2023 0408   PROT 5.7 (L) 08/24/2023 0537   ALBUMIN  3.0 (L) 08/24/2023 0537   AST 16 08/24/2023 0537   ALT 16 08/24/2023 0537   ALKPHOS 70 08/24/2023 0537   BILITOT 0.7 08/24/2023 0537   GFRNONAA >60 08/31/2023 0348   GFRAA >60 11/14/2016 1236   No results found for: CHOL, HDL, LDLCALC, LDLDIRECT, TRIG, CHOLHDL Lab Results  Component Value Date   HGBA1C 6.9 (H) 08/24/2023   Lab Results  Component Value  Date  VITAMINB12 102 (L) 08/24/2023   Lab Results  Component Value Date   TSH 1.648 08/23/2023   11/13/24 B12 758  11/13/24 A1c 8.0  11/13/24 ATN panel --> A- T+ N-  11/13/24 CT head  1. No acute intracranial abnormality. 2. Mildly advanced periventricular white matter hypoattenuation for age.   ASSESSMENT AND PLAN  89 y.o. year old male here with:   Dx:  1. 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; high suspicion for dementia with lewy bodies due to visual hallucinations, significant gait apraxia and fluctuating symptoms; will check addl testing to rule out other causes due to rapid progression; gait difficulty could be due to combination of dementia + hip arthritis + diabetic neuropathy) - check paraneoplastic panel, TPO, thyroglobulin ab, B1 level - try to stay active physically and get some exercise (at least 15-30 minutes per day) - eat a nutritious diet with lean protein, plants / vegetables, whole grains; avoid ultra-processed foods - increase social activities, brain stimulation, games, puzzles, hobbies, crafts, arts, music; try new activities; keep it fun! - aim for at least 7-8 hours sleep per night (or more) - avoid smoking and alcohol - needs supervision of medications, finances; cannot live alone; no driving - safety / supervision issues reviewed - caregiver resources provided (including [email or web address])  Orders Placed This Encounter  Procedures   Autoimmune Neurology Ab   Anti-TPO Ab (RDL)   Thyroglobulin antibody   Vitamin B1   Return for return to PCP, pending if symptoms worsen or fail to improve.  I spent 60 minutes of face-to-face and non-face-to-face time with patient.  This included previsit chart review, lab review, study review, order entry, electronic health record documentation, patient education.     EDUARD FABIENE HANLON, MD 12/15/2024, 4:01 PM Certified in Neurology,  Neurophysiology and Neuroimaging  [facility] [address], [address], [place] [address] [phone]     [1] No Known Allergies

## 2024-12-18 ENCOUNTER — Ambulatory Visit: Payer: Self-pay | Admitting: Diagnostic Neuroimaging

## 2024-12-19 LAB — AUTOIMMUNE NEUROLOGY AB
CASPR2 Antibody,Cell-based IFA: NEGATIVE
LGI1 Antibody, Cell-based IFA: NEGATIVE
VGCC Antibody: <1 pmol/L (ref 0.0–30.0)

## 2024-12-19 LAB — THYROGLOBULIN ANTIBODY: Thyroglobulin Antibody: <1 [IU]/mL (ref 0.0–0.9)

## 2024-12-19 LAB — VITAMIN B1: Thiamine: 164.3 nmol/L (ref 66.5–200.0)

## 2024-12-19 LAB — ANTI-TPO AB (RDL): Anti-TPO Ab (RDL): <9 [IU]/mL
